# Patient Record
Sex: Male | Born: 1975 | Race: White | Hispanic: No | Marital: Married | Smoking: Never smoker
Health system: Southern US, Community
[De-identification: ages and names within clinical notes are randomized; demographics above are authoritative.]

## PROBLEM LIST (undated history)

## (undated) DIAGNOSIS — J45909 Unspecified asthma, uncomplicated: Secondary | ICD-10-CM

## (undated) DIAGNOSIS — E079 Disorder of thyroid, unspecified: Secondary | ICD-10-CM

## (undated) DIAGNOSIS — M109 Gout, unspecified: Secondary | ICD-10-CM

## (undated) HISTORY — DX: Gout, unspecified: M10.9

## (undated) HISTORY — DX: Unspecified asthma, uncomplicated: J45.909

## (undated) HISTORY — PX: ANTERIOR CRUCIATE LIGAMENT REPAIR: SHX115

---

## 2005-12-20 ENCOUNTER — Emergency Department: Payer: Self-pay | Admitting: Emergency Medicine

## 2009-08-16 ENCOUNTER — Emergency Department: Payer: Self-pay | Admitting: Emergency Medicine

## 2010-08-30 IMAGING — CR LEFT WRIST - COMPLETE 3+ VIEW
1 series · 4 of 4 positions shown · non-contrast
Comparison: none

REASON FOR EXAM: pain
COMMENTS:

PROCEDURE:     DXR - DXR WRIST LT COMP WITH OBLIQUES  - August 16, 2009  [DATE]
RESULT:     No fracture, dislocation or other acute bony abnormality is
identified.

[Series 1: view not recorded · 0.17mm/px · 4 of 4 slices shown]
[im 1/4]
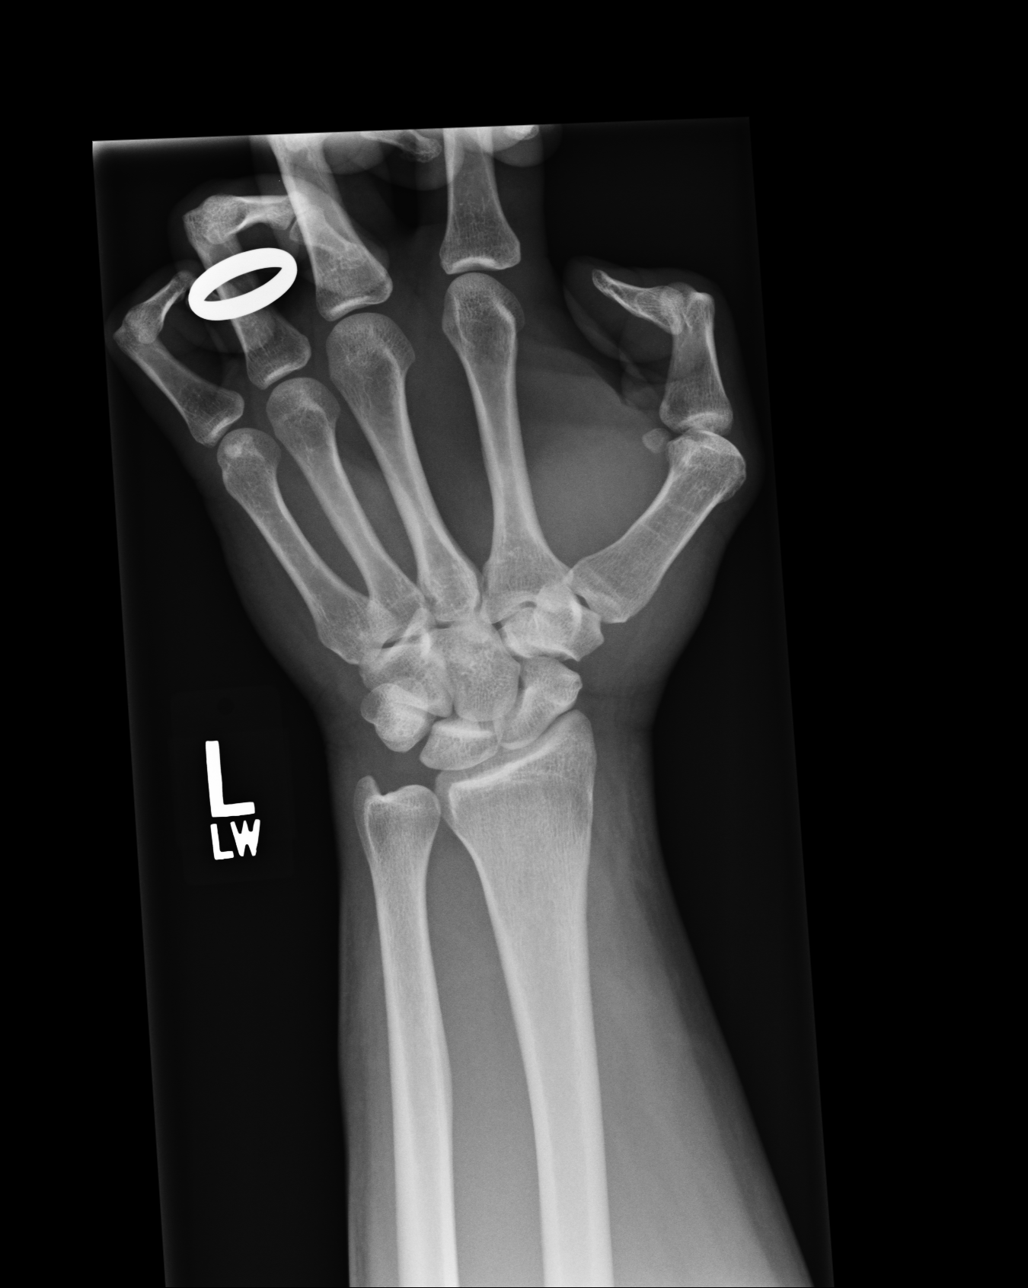
[im 2/4]
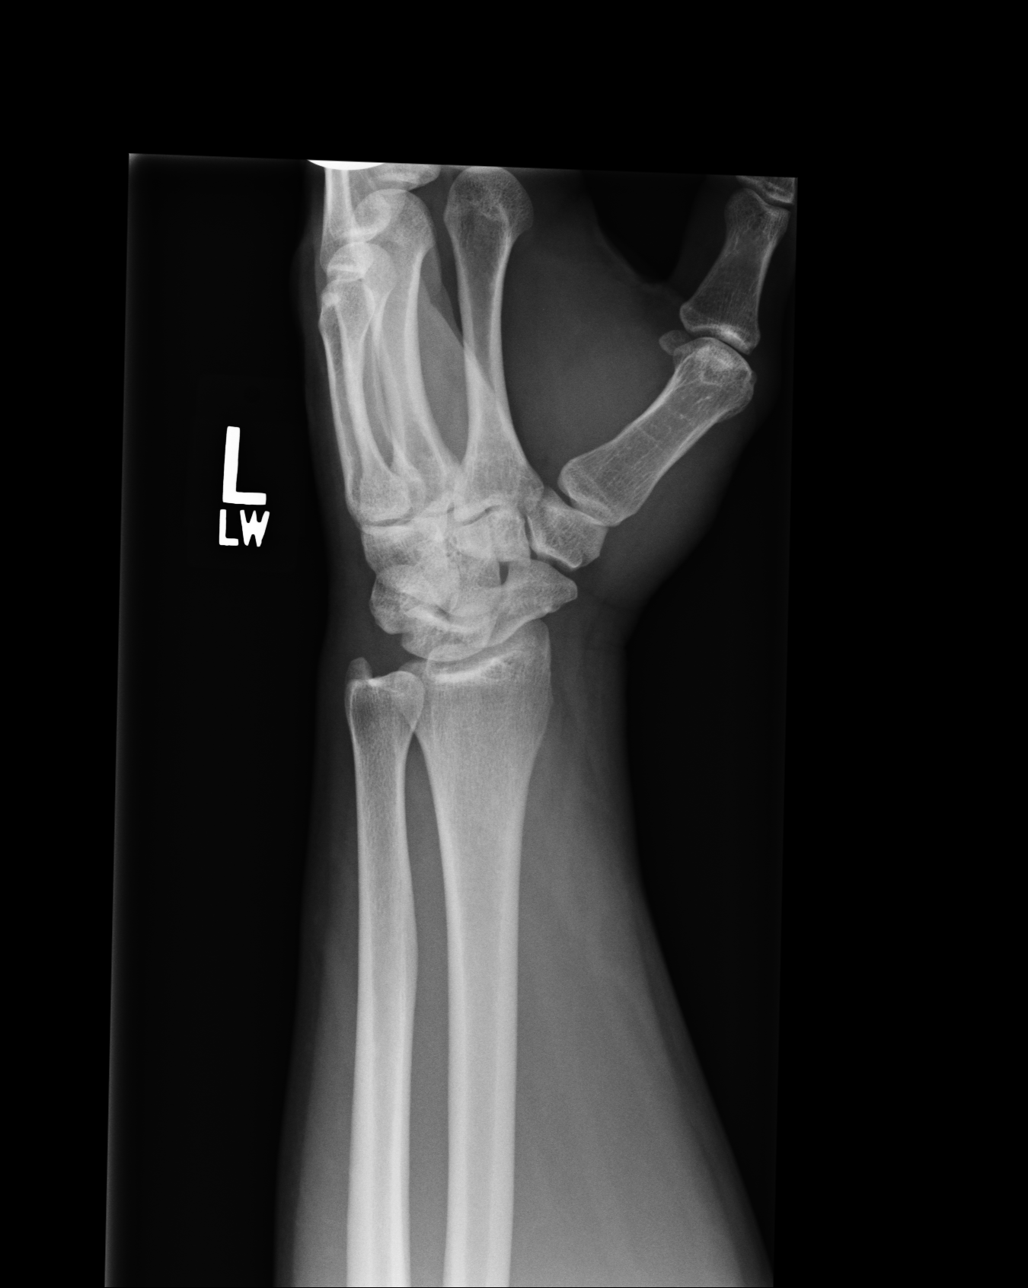
[im 3/4]
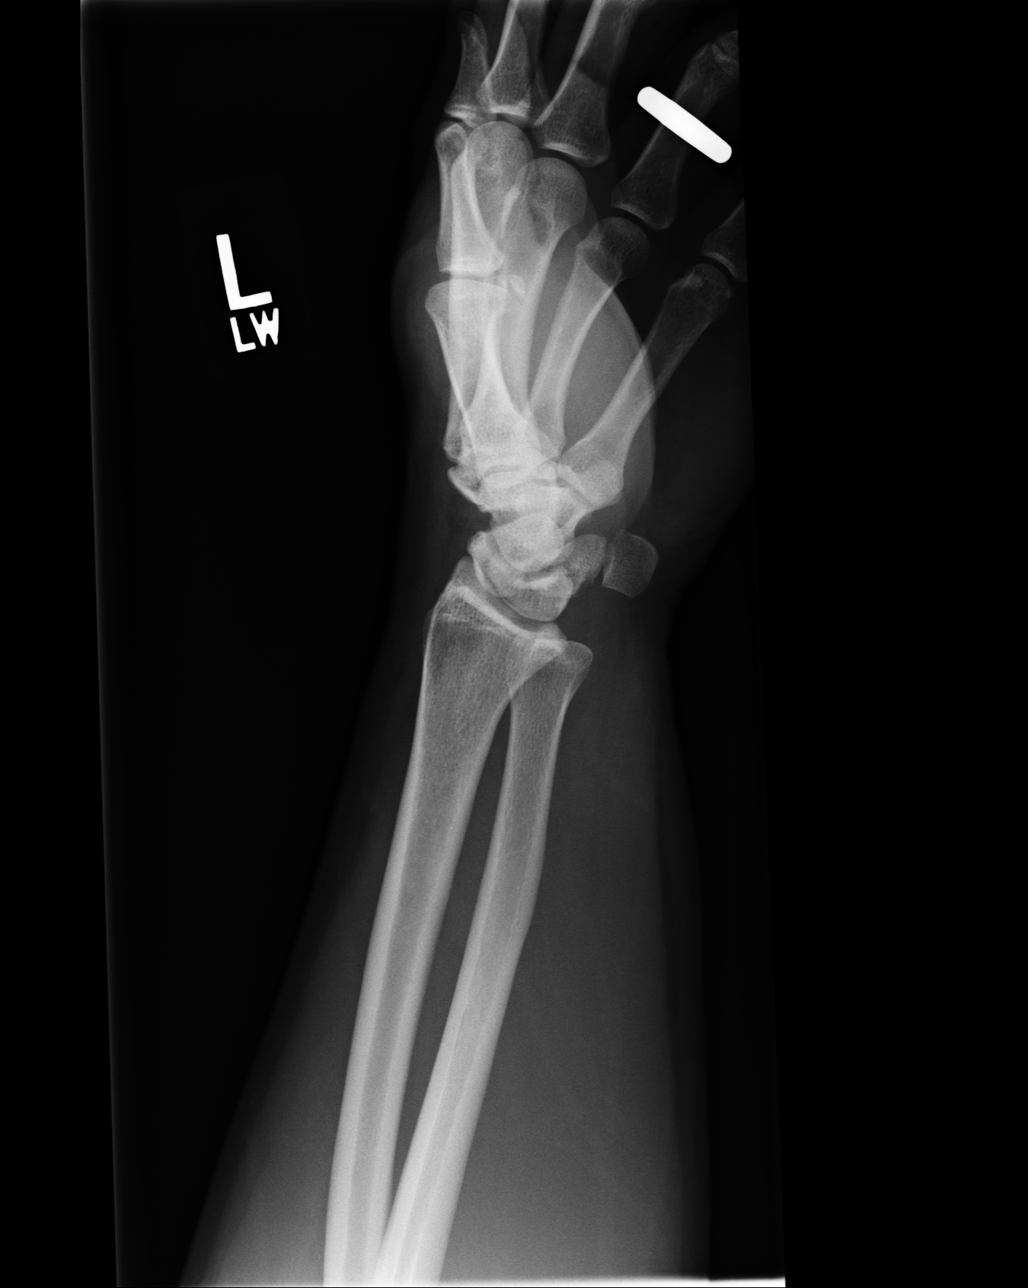
[im 4/4]
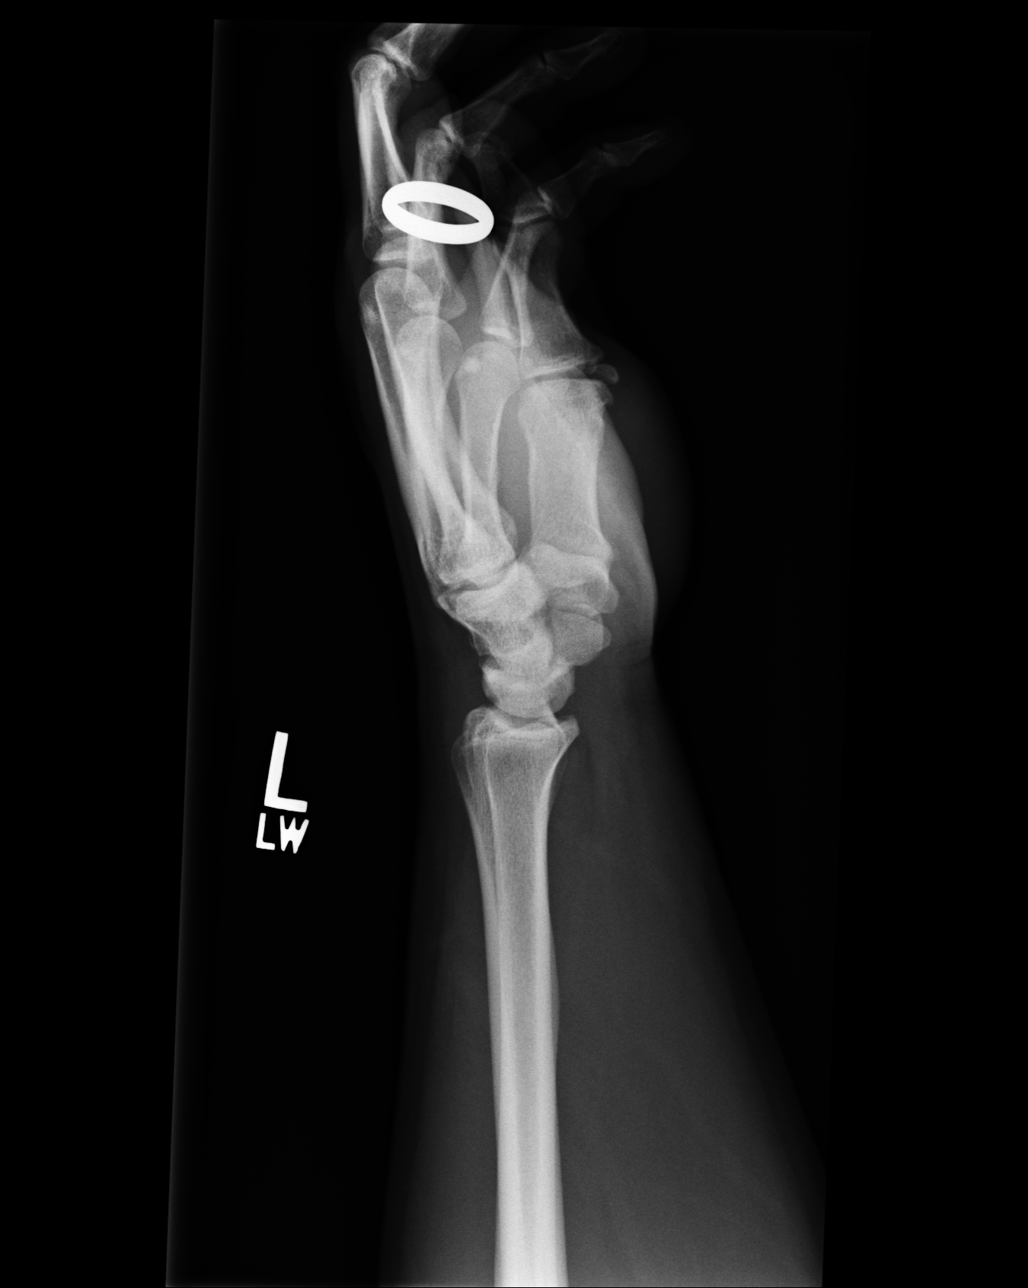

[4 of 4 positions shown; findings below may reference images not displayed]

IMPRESSION: No significant abnormalities are noted.

## 2010-09-08 ENCOUNTER — Ambulatory Visit: Payer: Self-pay | Admitting: Family Medicine

## 2010-09-19 ENCOUNTER — Ambulatory Visit: Payer: Self-pay | Admitting: Family Medicine

## 2011-05-07 ENCOUNTER — Ambulatory Visit: Payer: Self-pay | Admitting: Family Medicine

## 2011-05-12 ENCOUNTER — Ambulatory Visit: Payer: Self-pay | Admitting: Family Medicine

## 2013-08-26 ENCOUNTER — Emergency Department: Payer: Self-pay | Admitting: Emergency Medicine

## 2013-12-07 ENCOUNTER — Emergency Department: Payer: Self-pay | Admitting: Emergency Medicine

## 2014-12-21 IMAGING — CR DG TIBIA/FIBULA 2V*L*
1 series · 3 of 3 positions shown · non-contrast
Comparison: None.

CLINICAL DATA: Injury and heard a pop while kicking in a door. Mid
calf pain.

EXAM:
LEFT TIBIA AND FIBULA - 2 VIEW

[Series 1: x tib-fib ap left · 0.14mm/px · 3 of 3 slices shown]
[im 1/3]
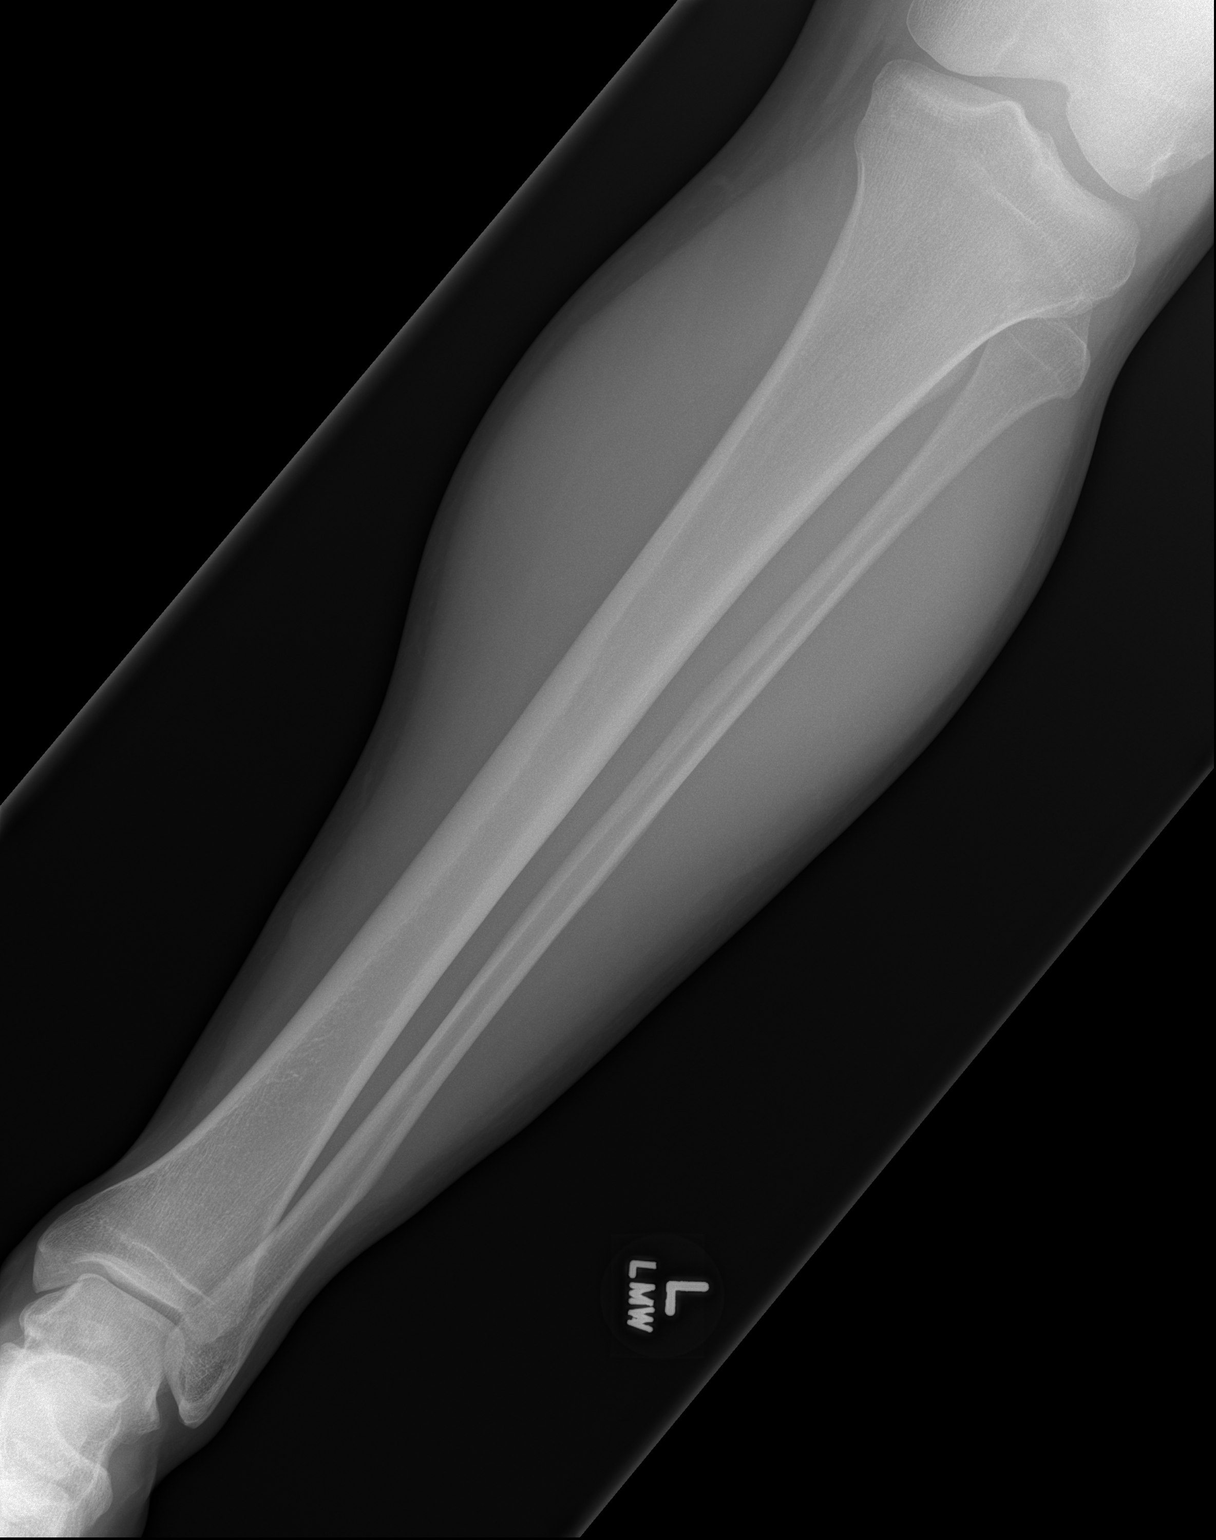
[im 2/3]
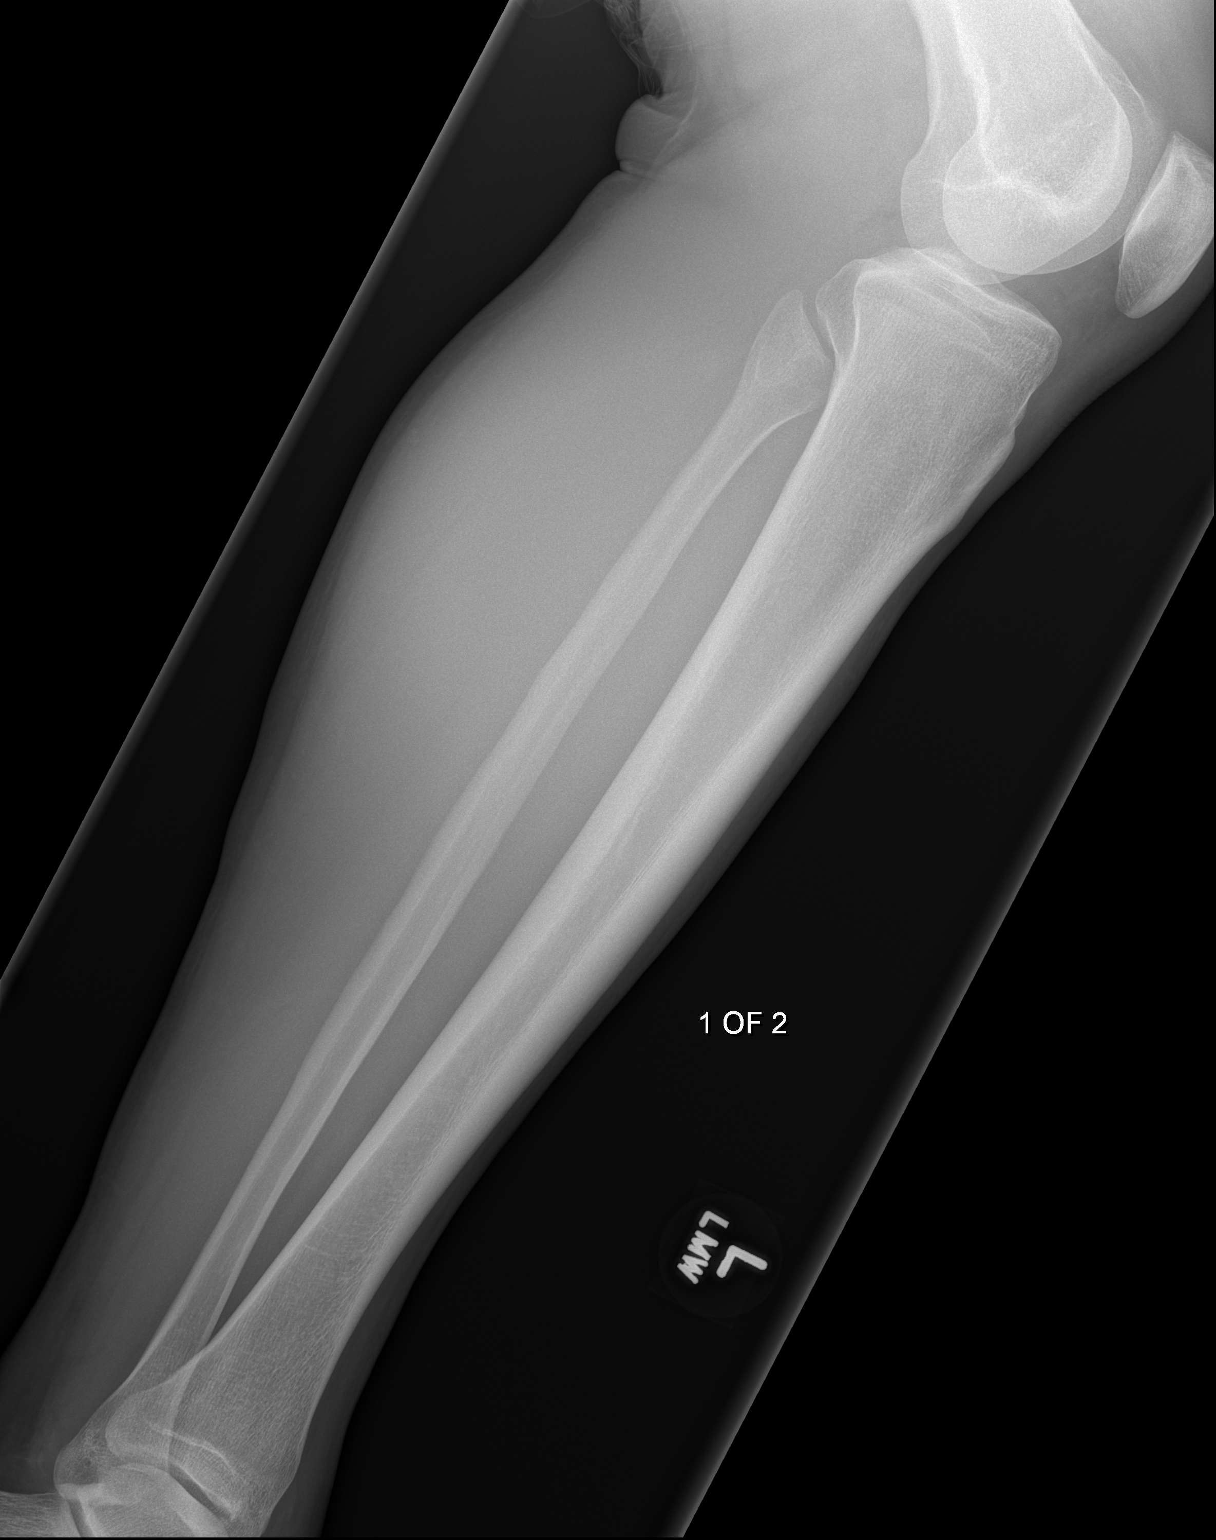
[im 3/3]
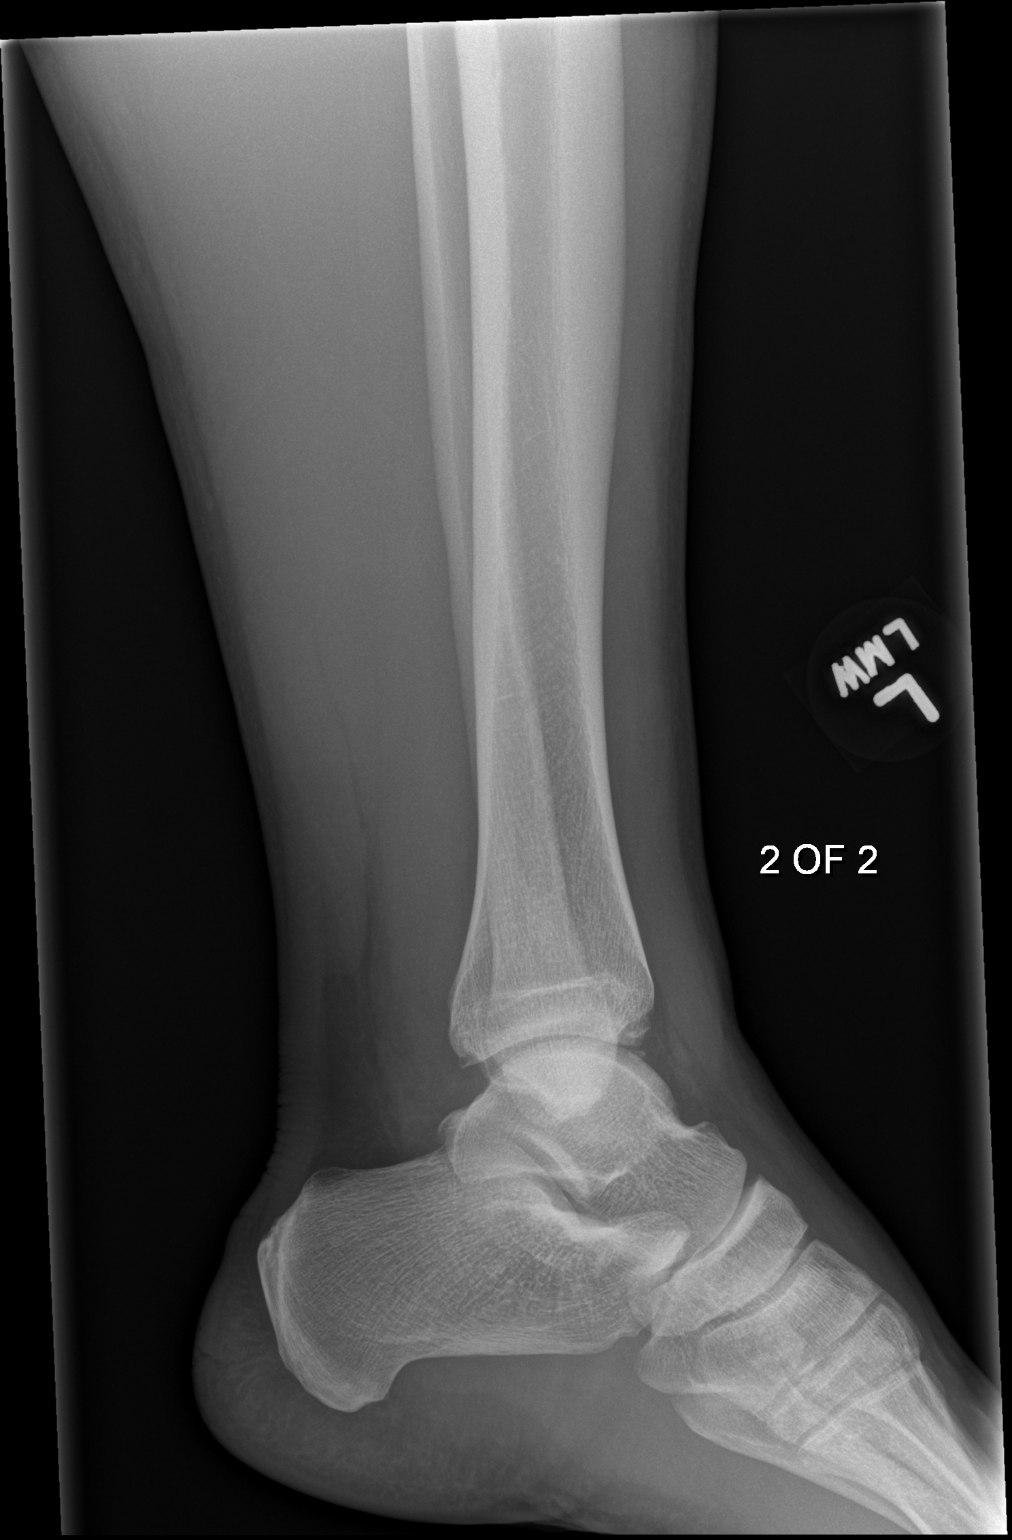

[3 of 3 positions shown; findings below may reference images not displayed]

FINDINGS: Degenerative changes in the ankle joint with old appearing ununited
ossicle inferior to the anterior aspect of the distal tibia. There
is no evidence of fracture or other focal bone lesions. Soft tissues
are unremarkable.
IMPRESSION: No acute bony abnormalities demonstrated. Old ununited ossicle
anterior to the tibiotalar joint could represent a loose body.

## 2015-12-19 ENCOUNTER — Ambulatory Visit (INDEPENDENT_AMBULATORY_CARE_PROVIDER_SITE_OTHER): Payer: 59 | Admitting: Obstetrics and Gynecology

## 2015-12-19 ENCOUNTER — Encounter: Payer: Self-pay | Admitting: Obstetrics and Gynecology

## 2015-12-19 VITALS — BP 140/95 | HR 71 | Resp 16 | Ht 72.0 in | Wt 257.8 lb

## 2015-12-19 DIAGNOSIS — Z3009 Encounter for other general counseling and advice on contraception: Secondary | ICD-10-CM

## 2015-12-19 DIAGNOSIS — Z309 Encounter for contraceptive management, unspecified: Secondary | ICD-10-CM | POA: Diagnosis not present

## 2015-12-19 MED ORDER — DIAZEPAM 10 MG PO TABS: ORAL_TABLET | ORAL | Status: DC

## 2015-12-19 NOTE — Patient Instructions (Signed)
                                                           Vasectomy                                    Patient Education and Post Care   If you were provided a sedative (Valium) you must have someone available to drive you home after your procedure. Avoid strenuous activity for 48 hours after your procedure. This includes any heavy lifting. You may return to work the next day, as long as no strenuous activity is required. Leave bandage in place for the next 24hrs. If you have any difficulty urinating remove you may remove bandage earlier. Then keep area clean and dry. You may shower after 24 hours. Do not take a bath or use a hot tub for five (5) days. You may apply ice or a cold pack to the scrotum as needed, for 10 minutes of every hours. (A bag of frozen peas will mold to the area). This may help reduce any pain or swelling. It may also be helpful to wear supportive underwear, such as briefs. Expect some mild pain, mild swelling of the scrotum and possible slight fluid leak from the site of the puncture. A gauze pad may be applied to the scrotum if there is any leakage from the puncture site. This drainage may continue for several days and is normal. You may continue your normal diet. After the procedure, you will be given 1-2 specimen cups. You need to do sperm checks at 6 and/or 12 weeks. Please bring the specimen back to our office to be sent out for analysis. You may resume having sex 1 week after procedure, if comfortable enough. Until you are told that you are sterile, it is essential that you use another form of birth control until otherwise notified by our office. Take Extra-Strength Tylenol, Motrin or Advil as needed for any pain or discomfort. Follow the package directions regarding dose. Stronger prescription pain medication is provided, but most people do not find that it is necessary. If you experience unusual or severe pain that is not relieved by pain medication, excessive bleeding  or drainage, excessive swelling or redness, foul odor or a fever over 101 F, please contact our office.  [facility] [address], [address] [place], [place] [address] [phone]     

## 2015-12-19 NOTE — Progress Notes (Signed)
12/19/2015 1:08 PM   Nicholas Russell [birth year] [id number]  Referring provider: No referring provider defined for this encounter.  Chief Complaint  Patient presents with  . VAS Consult  . Establish Care    HPI: Patient is a 40 year old male presenting today for vasectomy consultation. He is married with 2 biological children. His wife are not currently using any contraception. He denies any history of scrotal infections, injuries or chronic scrotal pain. He denies any bleeding or clotting disorders. He denies any previous reactions to look for anesthesia. States that he and his wife are in agreement that they desire no further children and are requesting permanent male sterilization.  PMH: Past Medical History  Diagnosis Date  . Asthma   . Gout     Surgical History: Past Surgical History  Procedure Laterality Date  . Anterior cruciate ligament repair Right 2000, 2001    Home Medications:    Medication List       This list is accurate as of: 12/19/15  1:08 PM.  Always use your most recent med list.               diazepam 10 MG tablet  Commonly known as:  VALIUM  Take 45 mins prior to procedure        Allergies: No Known Allergies  Family History: No family history on file.  Social History:  reports that he has never smoked. He does not have any smokeless tobacco history on file. He reports that he drinks alcohol. He reports that he does not use illicit drugs.  ROS: UROLOGY Frequent Urination?: No Hard to postpone urination?: No Burning/pain with urination?: No Get up at night to urinate?: No Leakage of urine?: No Urine stream starts and stops?: No Trouble starting stream?: No Do you have to strain to urinate?: No Blood in urine?: No Urinary tract infection?: No Sexually transmitted disease?: No Injury to kidneys or bladder?: No Painful intercourse?: No Weak stream?: No Erection problems?: No Penile pain?: No  Gastrointestinal Nausea?:  No Vomiting?: No Indigestion/heartburn?: No Diarrhea?: No Constipation?: No  Constitutional Fever: No Night sweats?: No Weight loss?: No Fatigue?: No  Skin Skin rash/lesions?: No Itching?: No  Eyes Blurred vision?: No Double vision?: No  Ears/Nose/Throat Sore throat?: No Sinus problems?: No  Hematologic/Lymphatic Swollen glands?: No Easy bruising?: No  Cardiovascular Leg swelling?: No Chest pain?: No  Respiratory Cough?: No Shortness of breath?: No  Endocrine Excessive thirst?: No  Musculoskeletal Back pain?: Yes Joint pain?: No  Neurological Headaches?: No Dizziness?: No  Psychologic Depression?: No Anxiety?: No  Physical Exam: BP 140/95 mmHg  Pulse 71  Resp 16  Ht 6' (1.829 m)  Wt 257 lb 12.8 oz (116.937 kg)  BMI 34.96 kg/m2  Constitutional:  Alert and oriented, No acute distress. HEENT: [place] AT, moist mucus membranes.  Trachea midline, no masses. Cardiovascular: No clubbing, cyanosis, or edema. Respiratory: Normal respiratory effort, no increased work of breathing. GU: Testicles descended bilaterally without palpable masses or tenderness, vas deferens palpable bilaterally Skin: No rashes, bruises or suspicious lesions. Neurologic: Grossly intact, no focal deficits, moving all 4 extremities. Psychiatric: Normal mood and affect.   Assessment & Plan:   Today, we discussed what the vas deferens is, where it is located, and its function. We reviewed the procedure for vasectomy, it's risks, benefits, alternatives, and likelihood of achieving his goals. We discussed in detail the procedure, complications, and recovery as well as the need for clearance prior to unprotected intercourse. We discussed that  vasectomy does not protect against sexually transmitted diseases. We discussed that this procedure does not result in immediate sterility and that they would need to use other forms of birth control until he has been cleared with negative postvasectomy  semen analyses. I explained that the procedure is considered to be permanent and that attempts at reversal have varying degrees of success. These options include vasectomy reversal, sperm retrieval, and in vitro fertilization; these can be very expensive. We discussed the chance of postvasectomy pain syndrome which occurs in less than 5% of patients. I explained to the patient that there is no treatment to resolve this chronic pain, and that if it developed I would not be able to help resolve the issue, but that surgery is generally not needed for correction. I explained there have even been reports of systemic like illness associated with this chronic pain, and that there was no good cure. I explained that vasectomy it is not a 100% reliable form of birth control, and the risk of pregnancy after vasectomy is approximately 1 in 2000 men who had a negative postvasectomy semen analysis or rare non-motile sperm. I explained that repeat vasectomy was necessary in less than 1% of vasectomy procedures when employing the type of technique that our surgeons use. I explained that he should refrain from ejaculation for approximately one week following vasectomy. I explained that there are other options for birth control which are permanent and non-permanent; we discussed these. I explained the rates of surgical complications, such as symptomatic hematoma or infection, are low (1-2%) and vary with the surgeon's experience and criteria used to diagnose the complication.  The patient had the opportunity to ask questions to his stated satisfaction. He voiced understanding of the above factors and stated that he has read all the information provided to him and the packets and informed consent was signed today.  1. Vasectomy consult:  Patient's questions have been answered and he was given the pre-op vasectomy instruction sheet.  He is prescribed Valium 10 mg and instructed to take it 30 minutes prior to his vasectomy  appointment.  He is to have a driver.  I reemphasized to the patient that this is to be considered a permanent form of birth control, that he is to use an alternative form of birth control until we receive the 3 months specimen and it is cleared of sperm and that this will not prevent STI's.  His questions are answered to his satisfaction and he understands the risks and is willing to proceed with the vasectomy.  He will schedule his vasectomy.    Patient was also examined by Dr. Sherryl Barters and deemed ok for in office procedure.  I spent 30 min with this patient of which greater than 50% was spent in counseling and coordination of care with the patient.   There are no diagnoses linked to this encounter.  Return for schedule vasectomy.  These notes generated with voice recognition software. I apologize for typographical errors.  Earlie Lou, FNP  [facility] [address] [place], [place] [address] [phone]

## 2016-01-09 ENCOUNTER — Encounter: Payer: Self-pay | Admitting: Urology

## 2016-02-06 ENCOUNTER — Encounter: Payer: Self-pay | Admitting: Urology

## 2016-08-21 DIAGNOSIS — R5383 Other fatigue: Secondary | ICD-10-CM | POA: Insufficient documentation

## 2016-08-21 DIAGNOSIS — J4520 Mild intermittent asthma, uncomplicated: Secondary | ICD-10-CM | POA: Insufficient documentation

## 2016-10-28 DIAGNOSIS — E349 Endocrine disorder, unspecified: Secondary | ICD-10-CM | POA: Insufficient documentation

## 2017-01-21 ENCOUNTER — Ambulatory Visit (INDEPENDENT_AMBULATORY_CARE_PROVIDER_SITE_OTHER): Payer: Managed Care, Other (non HMO)

## 2017-01-21 ENCOUNTER — Ambulatory Visit
Admission: EM | Admit: 2017-01-21 | Discharge: 2017-01-21 | Disposition: A | Discharge status: Home / Self Care | Payer: Managed Care, Other (non HMO) | Attending: Family Medicine | Admitting: Family Medicine

## 2017-01-21 ENCOUNTER — Encounter: Payer: Self-pay | Admitting: *Deleted

## 2017-01-21 DIAGNOSIS — S62653A Nondisplaced fracture of medial phalanx of left middle finger, initial encounter for closed fracture: Secondary | ICD-10-CM

## 2017-01-21 DIAGNOSIS — S62629A Displaced fracture of medial phalanx of unspecified finger, initial encounter for closed fracture: Secondary | ICD-10-CM

## 2017-01-21 NOTE — ED Triage Notes (Signed)
Left middle finger pain and edema x1 month. Unknown injury over a month ago. Left middle finger appears mildly edematous.

## 2017-01-21 NOTE — ED Provider Notes (Signed)
[facility]    CSN: [id number] Arrival date & time: 01/21/17  1315     History   Chief Complaint Chief Complaint  Patient presents with  . Finger Injury    HPI Nicholas Russell is a 41 y.o. male.   41 yo male with a c/o left middle finger pain for one month with intermittent swelling. Denies any specific trauma, fall, direct injury, fevers, chills, rash, drainage.    The history is provided by the patient.  Hand Pain     Past Medical History:  Diagnosis Date  . Asthma   . Gout     There are no active problems to display for this patient.   Past Surgical History:  Procedure Laterality Date  . ANTERIOR CRUCIATE LIGAMENT REPAIR Right 2000, 2001       Home Medications    Prior to Admission medications   Medication Sig Start Date End Date Taking? Authorizing Provider  diazepam (VALIUM) 10 MG tablet Take 45 mins prior to procedure 12/19/15   Fernanda DrumLindsay C Overton, FNP    Family History History reviewed. No pertinent family history.  Social History Social History  Substance Use Topics  . Smoking status: Never Smoker  . Smokeless tobacco: Never Used  . Alcohol use 0.0 oz/week     Allergies   Patient has no known allergies.   Review of Systems Review of Systems   Physical Exam Triage Vital Signs ED Triage Vitals  Enc Vitals Group     BP 01/21/17 1332 136/78     Pulse Rate 01/21/17 1332 80     Resp 01/21/17 1332 16     Temp 01/21/17 1332 98.6 F (37 C)     Temp Source 01/21/17 1332 Oral     SpO2 01/21/17 1332 97 %     Weight 01/21/17 1334 265 lb (120.2 kg)     Height 01/21/17 1334 6' (1.829 m)     Head Circumference --      Peak Flow --      Pain Score --      Pain Loc --      Pain Edu? --      Excl. in GC? --    No data found.   Updated Vital Signs BP 136/78 (BP Location: Left Arm)   Pulse 80   Temp 98.6 F (37 C) (Oral)   Resp 16   Ht 6' (1.829 m)   Wt 265 lb (120.2 kg)   SpO2 97%   BMI 35.94 kg/m   Visual  Acuity Right Eye Distance:   Left Eye Distance:   Bilateral Distance:    Right Eye Near:   Left Eye Near:    Bilateral Near:     Physical Exam  Constitutional: He appears well-developed and well-nourished. No distress.  Musculoskeletal:       Left hand: He exhibits tenderness, bony tenderness (PIP joint) and swelling. He exhibits normal range of motion, normal two-point discrimination, normal capillary refill, no deformity and no laceration. Normal sensation (mild; PIP joint) noted. Normal strength noted.  Skin: He is not diaphoretic.  Nursing note and vitals reviewed.    UC Treatments / Results  Labs (all labs ordered are listed, but only abnormal results are displayed) Labs Reviewed - No data to display  EKG  EKG Interpretation None       Radiology Dg Finger Middle Left  Result Date: 01/21/2017 CLINICAL DATA:  Left middle pain and edema for the past month following unknown injury.  Visible swelling. Pain is centered at the PIP joint. EXAM: LEFT MIDDLE FINGER 2+V COMPARISON:  None in PACs FINDINGS: The patient has sustained a tiny avulsion from the ventral aspect of the base of the middle phalanx. The joint space is reasonably well-maintained. There is mild soft tissue swelling over the proximal phalanx and PIP joint. The distal phalanx and the PIP joint appear normal. The third metacarpal also appears normal. IMPRESSION: Findings compatible with a small avulsion fracture from the ventral aspect of the base of the middle phalanx of the left third finger. Electronically Signed   By: David  [place] M.D.   On: 01/21/2017 14:33    Procedures Procedures (including critical care time)  Medications Ordered in UC Medications - No data to display   Initial Impression / Assessment and Plan / UC Course  I have reviewed the triage vital signs and the nursing notes.  Pertinent labs & imaging results that were available during my care of the patient were reviewed by me and considered  in my medical decision making (see chart for details).       Final Clinical Impressions(s) / UC Diagnoses   Final diagnoses:  Closed avulsion fracture of middle phalanx of finger, initial encounter  Nondisplaced fracture of middle phalanx of left middle finger, initial encounter for closed fracture    New Prescriptions Discharge Medication List as of 01/21/2017  3:15 PM     1. x-ray results and diagnosis reviewed with patient 2. Finger splint applied for immobilization 3. Recommend supportive treatment with otc analgesics prn 4. Follow-up with orthopedist hand specialist for further evaluation and management (patient verbalizes understanding and will call in am to set up appointment at [facility] in [place] where he has been seen in the past)    Payton Mccallum, MD 01/21/17 1816

## 2017-01-21 NOTE — Discharge Instructions (Signed)
Recommend follow up with Hand Orthopedist for further evaluation and management

## 2017-09-17 ENCOUNTER — Emergency Department: Payer: Worker's Compensation

## 2017-09-17 ENCOUNTER — Encounter: Payer: Self-pay | Admitting: Emergency Medicine

## 2017-09-17 ENCOUNTER — Emergency Department
Admission: EM | Admit: 2017-09-17 | Discharge: 2017-09-17 | Disposition: A | Discharge status: Home / Self Care | Payer: Worker's Compensation | Attending: Emergency Medicine | Admitting: Emergency Medicine

## 2017-09-17 DIAGNOSIS — Y9389 Activity, other specified: Secondary | ICD-10-CM | POA: Insufficient documentation

## 2017-09-17 DIAGNOSIS — Y990 Civilian activity done for income or pay: Secondary | ICD-10-CM | POA: Insufficient documentation

## 2017-09-17 DIAGNOSIS — S0990XA Unspecified injury of head, initial encounter: Secondary | ICD-10-CM | POA: Diagnosis present

## 2017-09-17 DIAGNOSIS — J45909 Unspecified asthma, uncomplicated: Secondary | ICD-10-CM | POA: Diagnosis not present

## 2017-09-17 DIAGNOSIS — W208XXA Other cause of strike by thrown, projected or falling object, initial encounter: Secondary | ICD-10-CM | POA: Diagnosis not present

## 2017-09-17 DIAGNOSIS — M25512 Pain in left shoulder: Secondary | ICD-10-CM | POA: Diagnosis not present

## 2017-09-17 DIAGNOSIS — M25561 Pain in right knee: Secondary | ICD-10-CM | POA: Diagnosis not present

## 2017-09-17 DIAGNOSIS — Y92009 Unspecified place in unspecified non-institutional (private) residence as the place of occurrence of the external cause: Secondary | ICD-10-CM | POA: Diagnosis not present
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
## 2017-09-17 NOTE — ED Notes (Signed)
Spoke with patients supervisor, Shavar Gorka [phone]. He states nothing is required for workers comp.

## 2017-09-17 NOTE — Discharge Instructions (Signed)
please return for worsening headache, double vision, vomiting or not feeling like your self. follow-up with Dr. Rosita Kea or Dr. Ernest Pine for the shoulder and the knee.  he can follow-up with orthopedics for the shoulder and knee.

## 2017-09-17 NOTE — ED Notes (Signed)
Patient refuses c collar and hospital gown.

## 2017-09-17 NOTE — ED Notes (Signed)
Pt. Verbalizes understanding of d/c instructions and follow-up. VS stable and pain controlled per pt.  Pt. In NAD at time of d/c and denies further concerns regarding this visit. Pt. Stable at the time of departure from the unit, departing unit by the safest and most appropriate manner per that pt condition and limitations. Pt advised to return to the ED at any time for emergent concerns, or for new/worsening symptoms.   
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
## 2017-09-21 ENCOUNTER — Emergency Department
Admission: EM | Admit: 2017-09-21 | Discharge: 2017-09-21 | Disposition: A | Discharge status: Home / Self Care | Payer: Worker's Compensation | Attending: Emergency Medicine | Admitting: Emergency Medicine

## 2017-09-21 ENCOUNTER — Emergency Department: Payer: Worker's Compensation

## 2017-09-21 DIAGNOSIS — Y939 Activity, unspecified: Secondary | ICD-10-CM | POA: Insufficient documentation

## 2017-09-21 DIAGNOSIS — S0990XA Unspecified injury of head, initial encounter: Secondary | ICD-10-CM | POA: Diagnosis present

## 2017-09-21 DIAGNOSIS — Y999 Unspecified external cause status: Secondary | ICD-10-CM | POA: Insufficient documentation

## 2017-09-21 DIAGNOSIS — W208XXA Other cause of strike by thrown, projected or falling object, initial encounter: Secondary | ICD-10-CM | POA: Insufficient documentation

## 2017-09-21 DIAGNOSIS — S060X9A Concussion with loss of consciousness of unspecified duration, initial encounter: Secondary | ICD-10-CM | POA: Diagnosis not present

## 2017-09-21 DIAGNOSIS — J45909 Unspecified asthma, uncomplicated: Secondary | ICD-10-CM | POA: Insufficient documentation

## 2017-09-21 DIAGNOSIS — Y929 Unspecified place or not applicable: Secondary | ICD-10-CM | POA: Insufficient documentation

## 2017-09-21 DIAGNOSIS — S060X1A Concussion with loss of consciousness of 30 minutes or less, initial encounter: Secondary | ICD-10-CM

## 2017-09-21 MED ORDER — BUTALBITAL-APAP-CAFFEINE 50-325-40 MG PO TABS: 1.0000 | ORAL_TABLET | Freq: Four times a day (QID) | ORAL | 0 refills | Status: AC | PRN

## 2017-09-21 MED ORDER — ONDANSETRON 4 MG PO TBDP
4.0000 mg | ORAL_TABLET | Freq: Once | ORAL | Status: AC
  Administered 2017-09-21: 4 mg via ORAL
  Filled 2017-09-21: qty 1

## 2017-09-21 MED ORDER — ONDANSETRON HCL 4 MG PO TABS: 4.0000 mg | ORAL_TABLET | Freq: Every day | ORAL | 0 refills | Status: DC | PRN
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
## 2017-09-21 NOTE — ED Notes (Signed)
Patient refusing to use wheelchair to be transported to CT at this time.
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
## 2019-05-16 ENCOUNTER — Ambulatory Visit: Payer: Managed Care, Other (non HMO) | Admitting: Adult Health

## 2019-05-16 ENCOUNTER — Other Ambulatory Visit: Payer: Self-pay

## 2019-05-16 ENCOUNTER — Encounter: Payer: Self-pay | Admitting: Adult Health

## 2019-05-16 VITALS — BP 118/82 | HR 82 | Temp 98.6°F | Resp 16

## 2019-05-16 DIAGNOSIS — S8390XA Sprain of unspecified site of unspecified knee, initial encounter: Secondary | ICD-10-CM | POA: Insufficient documentation

## 2019-05-16 DIAGNOSIS — W57XXXA Bitten or stung by nonvenomous insect and other nonvenomous arthropods, initial encounter: Secondary | ICD-10-CM | POA: Diagnosis not present

## 2019-05-16 DIAGNOSIS — M109 Gout, unspecified: Secondary | ICD-10-CM | POA: Insufficient documentation

## 2019-05-16 DIAGNOSIS — R591 Generalized enlarged lymph nodes: Secondary | ICD-10-CM

## 2019-05-16 DIAGNOSIS — R599 Enlarged lymph nodes, unspecified: Secondary | ICD-10-CM

## 2019-05-16 LAB — CBC WITH DIFFERENTIAL/PLATELET
Basophils Absolute: 0 10*3/uL (ref 0.0–0.2)
Basos: 0 %
EOS (ABSOLUTE): 0.2 10*3/uL (ref 0.0–0.4)
Eos: 2 %
Hematocrit: 44.9 % (ref 37.5–51.0)
Hemoglobin: 15.3 g/dL (ref 13.0–17.7)
Immature Grans (Abs): 0 10*3/uL (ref 0.0–0.1)
Immature Granulocytes: 0 %
Lymphocytes Absolute: 2.1 10*3/uL (ref 0.7–3.1)
Lymphs: 21 %
MCH: 30.7 pg (ref 26.6–33.0)
MCHC: 34.1 g/dL (ref 31.5–35.7)
MCV: 90 fL (ref 79–97)
Monocytes Absolute: 1 10*3/uL — ABNORMAL HIGH (ref 0.1–0.9)
Monocytes: 10 %
Neutrophils Absolute: 6.5 10*3/uL (ref 1.4–7.0)
Neutrophils: 67 %
Platelets: 255 10*3/uL (ref 150–450)
RBC: 4.98 x10E6/uL (ref 4.14–5.80)
RDW: 12.3 % (ref 11.6–15.4)
WBC: 9.8 10*3/uL (ref 3.4–10.8)

## 2019-05-16 LAB — COMPREHENSIVE METABOLIC PANEL
ALT: 23 IU/L (ref 0–44)
AST: 23 IU/L (ref 0–40)
Albumin/Globulin Ratio: 1.9 (ref 1.2–2.2)
Albumin: 4.7 g/dL (ref 4.0–5.0)
Alkaline Phosphatase: 65 IU/L (ref 39–117)
BUN/Creatinine Ratio: 20 (ref 9–20)
BUN: 22 mg/dL (ref 6–24)
Bilirubin Total: 0.5 mg/dL (ref 0.0–1.2)
CO2: 20 mmol/L (ref 20–29)
Calcium: 9.8 mg/dL (ref 8.7–10.2)
Chloride: 104 mmol/L (ref 96–106)
Creatinine, Ser: 1.09 mg/dL (ref 0.76–1.27)
GFR calc Af Amer: 96 mL/min/{1.73_m2} (ref >59)
GFR calc non Af Amer: 83 mL/min/{1.73_m2} (ref >59)
Globulin, Total: 2.5 g/dL (ref 1.5–4.5)
Glucose: 87 mg/dL (ref 65–99)
Potassium: 4 mmol/L (ref 3.5–5.2)
Sodium: 139 mmol/L (ref 134–144)
Total Protein: 7.2 g/dL (ref 6.0–8.5)

## 2019-05-16 MED ORDER — DOXYCYCLINE HYCLATE 100 MG PO TABS: 100.0000 mg | ORAL_TABLET | Freq: Two times a day (BID) | ORAL | 0 refills | Status: DC

## 2019-05-16 NOTE — Progress Notes (Addendum)
[facility] Subjective:     Patient ID: Nicholas Russell, male   DOB: 1976/08/01, 43 y.o.   MRN: [id number]  Blood pressure 118/82, pulse 82, temperature 98.6 F (37 C), temperature source Oral, resp. rate 16, SpO2 97 %. HPI  Patient is a 43 year old male in no acute distress who comes to the clinic today after he removed a tick Friday 05/13/19 he feels it could have possibly been attached two to three days. It was on his right testicle. He had itching.  Yesterday 05/15/19 he had swelling to the skin on his right testicle, denies any pain. He also reports swelling in his groin with itching. He denies any pain.  He reports mild pain since removing the tick at the site.   He reports everything was normal prior to removing the tick.  He removed the tick with tweezers.  He has  Mild fatigue that started yesterday, denies headache, chills or any rash.  He feels as if he rempoved a lot in swelling in his groin since yesterday. He has continued to work.  Feeels fatugue. No fever.   No rash. Redness is improving in groin area.  He denies ever having this type of reaction to a tick bite in the past.   No difficulty urinating.  He denies any pain.  He is urinating normally.  Defecation is normal.  Patient  denies any fever, body aches,chills, rash, chest pain, shortness of breath, nausea, vomiting, or diarrhea.    Review of Systems  Constitutional: Positive for fatigue. Negative for activity change, appetite change, chills, diaphoresis, fever and unexpected weight change.  HENT: Negative.   Eyes: Negative.   Respiratory: Negative.   Cardiovascular: Negative.   Gastrointestinal: Negative.   Endocrine: Negative.   Genitourinary: Negative.  Negative for decreased urine volume, difficulty urinating, discharge, dysuria, enuresis, flank pain, frequency, genital sores, hematuria, penile pain, penile swelling, scrotal swelling, testicular pain and urgency.   Musculoskeletal: Negative.   Skin: Positive for color change. Negative for pallor, rash and wound.  Allergic/Immunologic: Negative.   Neurological: Negative.   Hematological: Positive for adenopathy (right groin ). Does not bruise/bleed easily.  Psychiatric/Behavioral: Negative.        Objective:   Physical Exam Vitals signs reviewed.  Constitutional:      General: He is not in acute distress.    Appearance: Normal appearance. He is normal weight. He is not ill-appearing, toxic-appearing or diaphoretic.  HENT:     Head: Normocephalic and atraumatic.     Right Ear: There is no impacted cerumen.     Left Ear: There is no impacted cerumen.     Nose: Nose normal. No congestion or rhinorrhea.     Mouth/Throat:     Pharynx: No oropharyngeal exudate or posterior oropharyngeal erythema.  Eyes:     General: No scleral icterus.       Right eye: No discharge.        Left eye: No discharge.  Cardiovascular:     Rate and Rhythm: Normal rate and regular rhythm.     Pulses: Normal pulses.     Heart sounds: Normal heart sounds. No murmur. No friction rub. No gallop.   Pulmonary:     Effort: Pulmonary effort is normal. No respiratory distress.     Breath sounds: Normal breath sounds. No stridor. No wheezing, rhonchi or rales.  Chest:     Chest wall: No tenderness.  Abdominal:     General: Bowel  sounds are normal. There is no distension.     Palpations: There is no mass.     Tenderness: There is no abdominal tenderness. There is no right CVA tenderness, left CVA tenderness, guarding or rebound.     Hernia: No hernia is present.  Genitourinary:    Pubic Area: No rash or pubic lice.      Penis: Erythema and swelling present. No tenderness, discharge or lesions.      Scrotum/Testes: Normal. Cremasteric reflex is present.        Right: Tenderness or swelling not present.        Left: Tenderness or swelling not present.     Epididymis:     Right: Normal.     Left: Normal.       Comments:  Right groin adenopathy mobile mildly tender in area on diagram overlying skin  Pink in color, no warmth, no discharge.  Area of tick bite marked on diagram right testicle, small scab present, no surrounding erythema. No edema. No rash on right testicle.  Musculoskeletal: Normal range of motion.  Skin:    General: Skin is warm.     Capillary Refill: Capillary refill takes less than 2 seconds.     Coloration: Skin is not jaundiced or pale.     Findings: Erythema present. No bruising, lesion or rash.     Nails: There is no clubbing.   Neurological:     General: No focal deficit present.     Mental Status: He is alert and oriented to person, place, and time.     Motor: No weakness.     Gait: Gait normal.  Psychiatric:        Mood and Affect: Mood normal.        Behavior: Behavior normal.        Thought Content: Thought content normal.        Judgment: Judgment normal.    Bretlyn Ward CMA available for chaperone.     Assessment:     Tick bite, initial encounter - Plan: B. burgdorfi antibodies, CBC with Differential/Platelet, Comprehensive metabolic panel, CANCELED: CBC with Differential/Platelet  Adenopathy      Plan:     He is aware if any symptoms change or worsen from what they are now he will go to the emergency room immediately.  Follow up in three 3 days.   Fayrene FearingJames was seen today for tick bite.  Diagnoses and all orders for this visit:  Tick bite, initial encounter -     Cancel: CBC with Differential/Platelet -     B. burgdorfi antibodies -     CBC with Differential/Platelet -     Comprehensive metabolic panel  Other orders -     doxycycline (VIBRA-TABS) 100 MG tablet; Take 1 tablet (100 mg total) by mouth 2 (two) times daily.  Ordered STAT CBC AND CMP.   Patient is a advised to go to  The emergency room should any symptoms worsen, increased swelling, any pain or any new symptoms develop.   Provider thoroughly discussed in collaboration above plan with supervising  physician Dr. Julieanne Mansonichard Gilbert who is in agreement with the care plan as above.

## 2019-05-16 NOTE — Patient Instructions (Addendum)
Tick Bite Information, Adult  Ticks are insects that can bite. Most ticks live in shrubs and grassy areas. They climb onto people and animals that go by. Then they bite. Some ticks carry germs that can make you sick. How can I prevent tick bites?  Use an insect repellent that has 20% or higher of the ingredients DEET, picaridin, or IR3535. Put this insect repellent on: ? Bare skin. ? The tops of your boots. ? Your pant legs. ? The ends of your sleeves.  If you use an insect repellent that has the ingredient permethrin, make sure to follow the instructions on the bottle. Treat the following: ? Clothing. ? Supplies. ? Boots. ? Tents.  Wear long sleeves, long pants, and light colors.  Tuck your pant legs into your socks.  Stay in the middle of the trail.  Try not to walk through long grass.  Before going inside your house, check your clothes, hair, and skin for ticks. Make sure to check your head, neck, armpits, waist, groin, and joint areas.  Check for ticks every day.  When you come indoors: ? Wash your clothes right away. ? Shower right away. ? Dry your clothes in a dryer on high heat for 60 minutes or more. What is the right way to remove a tick? Remove a tick from your skin as soon as possible.  To remove a tick that is crawling on your skin: ? Go outdoors and brush the tick off. ? Use tape or a lint roller.  To remove a tick that is biting: ? Wash your hands. ? If you have latex gloves, put them on. ? Use tweezers, curved forceps, or a tick-removal tool to grasp the tick. Grasp the tick as close to your skin and as close to the tick's head as possible. ? Gently pull up until the tick lets go.  Try to keep the tick's head attached to its body.  Do not twist or jerk the tick.  Do not squeeze or crush the tick. Do not try to remove a tick with heat, alcohol, petroleum jelly, or fingernail polish. How should I get rid of a tick? Here are some ways to get rid of a  tick that is alive:  Place the tick in rubbing alcohol.  Place the tick in a bag or container you can close tightly.  Wrap the tick tightly in tape.  Flush the tick down the toilet. Contact a doctor if:  You have symptoms of a disease, such as: ? Pain in a muscle, joint, or bone. ? Trouble walking or moving your legs. ? Numbness in your legs. ? Inability to move (paralysis). ? A red rash that makes a circle (bull's-eye rash). ? Redness and swelling where the tick bit you. ? A fever. ? Throwing up (vomiting) over and over. ? Diarrhea. ? Weight loss. ? Tender and swollen lymph glands. ? Shortness of breath. ? Cough. ? Belly pain (abdominal pain). ? Headache. ? Being more tired than normal. ? A change in how alert (conscious) you are. ? Confusion. Get help right away if:  You cannot remove a tick.  A part of a tick breaks off and gets stuck in your skin.  You are feeling worse. Summary  Ticks may carry germs that can make you sick.  To prevent tick bites, wear long sleeves, long pants, and light colors. Use insect repellent. Follow the instructions on the bottle.  If the tick is biting, do not try to remove   it with heat, alcohol, petroleum jelly, or fingernail polish.  Use tweezers, curved forceps, or a tick-removal tool to grasp the tick. Gently pull up until the tick lets go. Do not twist or jerk the tick. Do not squeeze or crush the tick.  If you have symptoms, contact a doctor. This information is not intended to replace advice given to you by your health care provider. Make sure you discuss any questions you have with your health care provider. Document Released: 02/18/2010 Document Revised: 03/06/2017 Document Reviewed: 03/06/2017 Elsevier Interactive Patient Education  2019 Else[employer]. Lymphadenopathy  Lymphadenopathy means that your lymph glands are swollen or larger than normal (enlarged). Lymph glands, also called lymph nodes, are collections of tissue  that filter bacteria, viruses, and waste from your bloodstream. They are part of your body's disease-fighting system (immune system), which protects your body from germs. There may be different causes of lymphadenopathy, depending on where it is in your body. Some types go away on their own. Lymphadenopathy can occur anywhere that you have lymph glands, including these areas:  Neck (cervical lymphadenopathy).  Chest (mediastinal lymphadenopathy).  Lungs (hilar lymphadenopathy).  Underarms (axillary lymphadenopathy).  Groin (inguinal lymphadenopathy). When your immune system responds to germs, infection-fighting cells and fluid build up in your lymph glands. This causes some swelling and enlargement. If the lymph glands do not go back to normal after you have an infection or disease, your health care provider may do tests. These tests help to monitor your condition and find the reason why the glands are still swollen and enlarged. Follow these instructions at home:  Get plenty of rest.  Take over-the-counter and prescription medicines only as told by your health care provider. Your health care provider may recommend over-the-counter medicines for pain.  If directed, apply heat to swollen lymph glands as often as told by your health care provider. Use the heat source that your health care provider recommends, such as a moist heat pack or a heating pad. ? Place a towel between your skin and the heat source. ? Leave the heat on for 20-30 minutes. ? Remove the heat if your skin turns bright red. This is especially important if you are unable to feel pain, heat, or cold. You may have a greater risk of getting burned.  Check your affected lymph glands every day for changes. Check other lymph gland areas as told by your health care provider. Check for changes such as: ? More swelling. ? Sudden increase in size. ? Redness or pain. ? Hardness.  Keep all follow-up visits as told by your health  care provider. This is important. Contact a health care provider if you have:  Swelling that gets worse or spreads to other areas.  Problems with breathing.  Lymph glands that: ? Are still swollen after 2 weeks. ? Have suddenly gotten bigger. ? Are red, painful, or hard.  A fever or chills.  Fatigue.  A sore throat.  Pain in your abdomen.  Weight loss.  Night sweats. Get help right away if you have:  Fluid leaking from an enlarged lymph gland.  Severe pain.  Chest pain.  Shortness of breath. Summary  Lymphadenopathy means that your lymph glands are swollen or larger than normal (enlarged).  Lymph glands (also called lymph nodes) are collections of tissue that filter bacteria, viruses, and waste from the bloodstream. They are part of your body's disease-fighting system (immune system).  Lymphadenopathy can occur anywhere that you have lymph glands.  If your enlarged  and swollen lymph glands do not go back to normal after you have an infection or disease, your health care provider may do tests to monitor your condition and find the reason why the glands are still swollen and enlarged.  Check your affected lymph glands every day for changes. Check other lymph gland areas as told by your health care provider. This information is not intended to replace advice given to you by your health care provider. Make sure you discuss any questions you have with your health care provider. Document Released: 09/02/2008 Document Revised: 10/09/2017 Document Reviewed: 10/09/2017 Elsevier Interactive Patient Education  2019 [employer]. Doxycycline tablets or capsules What is this medicine? DOXYCYCLINE (dox i SYE kleen) is a tetracycline antibiotic. It kills certain bacteria or stops their growth. It is used to treat many kinds of infections, like dental, skin, respiratory, and urinary tract infections. It also treats acne, Lyme disease, malaria, and certain sexually transmitted  infections. This medicine may be used for other purposes; ask your health care provider or pharmacist if you have questions. COMMON BRAND NAME(S): Acticlate, Adoxa, Adoxa CK, Adoxa Pak, Adoxa TT, Alodox, Avidoxy, Doxal, LYMEPAK, Mondoxyne NL, Monodox, Morgidox 1x, Morgidox 1x Kit, Morgidox 2x, Morgidox 2x Kit, NutriDox, Ocudox, TARGADOX, Vibra-Tabs, Vibramycin What should I tell my health care provider before I take this medicine? They need to know if you have any of these conditions: -liver disease -long exposure to sunlight like working outdoors -stomach problems like colitis -an unusual or allergic reaction to doxycycline, tetracycline antibiotics, other medicines, foods, dyes, or preservatives -pregnant or trying to get pregnant -breast-feeding How should I use this medicine? Take this medicine by mouth with a full glass of water. Follow the directions on the prescription label. It is best to take this medicine without food, but if it upsets your stomach take it with food. Take your medicine at regular intervals. Do not take your medicine more often than directed. Take all of your medicine as directed even if you think you are better. Do not skip doses or stop your medicine early. Talk to your pediatrician regarding the use of this medicine in children. While this drug may be prescribed for selected conditions, precautions do apply. Overdosage: If you think you have taken too much of this medicine contact a poison control center or emergency room at once. NOTE: This medicine is only for you. Do not share this medicine with others. What if I miss a dose? If you miss a dose, take it as soon as you can. If it is almost time for your next dose, take only that dose. Do not take double or extra doses. What may interact with this medicine? -antacids -barbiturates -birth control pills -bismuth subsalicylate -carbamazepine -methoxyflurane -other antibiotics -phenytoin -vitamins that contain  iron -warfarin This list may not describe all possible interactions. Give your health care provider a list of all the medicines, herbs, non-prescription drugs, or dietary supplements you use. Also tell them if you smoke, drink alcohol, or use illegal drugs. Some items may interact with your medicine. What should I watch for while using this medicine? Tell your doctor or health care professional if your symptoms do not improve. Do not treat diarrhea with over the counter products. Contact your doctor if you have diarrhea that lasts more than 2 days or if it is severe and watery. Do not take this medicine just before going to bed. It may not dissolve properly when you lay down and can cause pain in your throat.  Drink plenty of fluids while taking this medicine to also help reduce irritation in your throat. This medicine can make you more sensitive to the sun. Keep out of the sun. If you cannot avoid being in the sun, wear protective clothing and use sunscreen. Do not use sun lamps or tanning beds/booths. Birth control pills may not work properly while you are taking this medicine. Talk to your doctor about using an extra method of birth control. If you are being treated for a sexually transmitted infection, avoid sexual contact until you have finished your treatment. Your sexual partner may also need treatment. Avoid antacids, aluminum, calcium, magnesium, and iron products for 4 hours before and 2 hours after taking a dose of this medicine. If you are using this medicine to prevent malaria, you should still protect yourself from contact with mosquitos. Stay in screened-in areas, use mosquito nets, keep your body covered, and use an insect repellent. What side effects may I notice from receiving this medicine? Side effects that you should report to your doctor or health care professional as soon as possible: -allergic reactions like skin rash, itching or hives, swelling of the face, lips, or tongue  -difficulty breathing -fever -itching in the rectal or genital area -pain on swallowing -redness, blistering, peeling or loosening of the skin, including inside the mouth -severe stomach pain or cramps -unusual bleeding or bruising -unusually weak or tired -yellowing of the eyes or skin Side effects that usually do not require medical attention (report to your doctor or health care professional if they continue or are bothersome): -diarrhea -loss of appetite -nausea, vomiting This list may not describe all possible side effects. Call your doctor for medical advice about side effects. You may report side effects to FDA at 1-800-FDA-1088. Where should I keep my medicine? Keep out of the reach of children. Store at room temperature, below 30 degrees C (86 degrees F). Protect from light. Keep container tightly closed. Throw away any unused medicine after the expiration date. Taking this medicine after the expiration date can make you seriously ill. NOTE: This sheet is a summary. It may not cover all possible information. If you have questions about this medicine, talk to your doctor, pharmacist, or health care provider.  2019 Elsevier/Gold Standard (2015-12-26 17:11:22)

## 2019-05-17 ENCOUNTER — Telehealth: Payer: Self-pay | Admitting: Adult Health

## 2019-05-17 LAB — B. BURGDORFI ANTIBODIES: Lyme IgG/IgM Ab: <0.91 {ISR} (ref 0.00–0.90)

## 2019-05-17 NOTE — Telephone Encounter (Signed)
Patient was called with his lab results.  All lab results were normal absolute monocytes were 1.0.  This was mildly elevated.  Patient reports that he is doing much better and that he has been icing and resting the area and feels as if his swelling is going down.  He denies any fever chills nausea vomiting and reports he is feeling better.  He will add 800 mg of ibuprofen every 8 hours until his recheck as well as continue taking his doxycycline.  He is advised that he may take Benadryl 25 to 50 mg per package instructions. Advised patient call the office or your primary care doctor for an appointment if no improvement within 72 hours or if any symptoms change or worsen at any time  Advised ER or urgent Care if after hours or on weekend. Call 911 for emergency symptoms at any time.Patinet verbalized understanding of all instructions given/reviewed and treatment plan and has no further questions or concerns at this time.     Patient verbalized understanding of all instructions given and denies any further questions at this time.

## 2019-05-18 NOTE — Progress Notes (Signed)
Called patient to inform negative lyme titer. He reports he is doing much better today. Denies any new symptoms.  Advised patient call the office or your primary care doctor for an appointment if no improvement within 72 hours or if any symptoms change or worsen at any time  Advised ER or urgent Care if after hours or on weekend. Call 911 for emergency symptoms at any time.Patinet verbalized understanding of all instructions given/reviewed and treatment plan and has no further questions or concerns at this time.

## 2019-05-19 ENCOUNTER — Other Ambulatory Visit: Payer: Self-pay

## 2019-05-19 ENCOUNTER — Encounter: Payer: Self-pay | Admitting: Adult Health

## 2019-05-19 ENCOUNTER — Ambulatory Visit: Payer: Managed Care, Other (non HMO) | Admitting: Adult Health

## 2019-05-19 VITALS — BP 122/76 | HR 74 | Temp 98.5°F | Resp 16 | Ht 71.0 in | Wt 240.0 lb

## 2019-05-19 DIAGNOSIS — W57XXXA Bitten or stung by nonvenomous insect and other nonvenomous arthropods, initial encounter: Secondary | ICD-10-CM | POA: Diagnosis not present

## 2019-05-19 DIAGNOSIS — Z8249 Family history of ischemic heart disease and other diseases of the circulatory system: Secondary | ICD-10-CM

## 2019-05-19 DIAGNOSIS — Z0189 Encounter for other specified special examinations: Secondary | ICD-10-CM | POA: Diagnosis not present

## 2019-05-19 DIAGNOSIS — Z008 Encounter for other general examination: Secondary | ICD-10-CM

## 2019-05-19 NOTE — Patient Instructions (Addendum)
Patient has family history and desired information he will discuss any screening with his primary care provider in the next to weeks.  Abdominal Aortic Aneurysm  An aneurysm is a bulge in one of the blood vessels that carry blood away from the heart (artery). It happens when blood pushes up against a weak or damaged place in the wall of an artery. An abdominal aortic aneurysm happens in the main artery of the body (aorta). Some aneurysms may not cause problems. If it grows, it can burst or tear, causing bleeding inside the body. This is an emergency. It needs to be treated right away. What are the causes? The exact cause of this condition is not known. What increases the risk? The following may make you more likely to get this condition:  Being a male who is 43 years of age or older.  Being white (Caucasian).  Using tobacco.  Having a family history of aneurysms.  Having the following conditions: ? Hardening of the arteries (arteriosclerosis). ? Inflammation of the walls of an artery (arteritis). ? Certain genetic conditions. ? Being very overweight (obesity). ? An infection in the wall of the aorta (infectious aortitis). ? High cholesterol. ? High blood pressure (hypertension). What are the signs or symptoms? Symptoms depend on the size of the aneurysm and how fast it is growing. Most grow slowly and do not cause any symptoms. If symptoms do occur, they may include:  Pain in the belly (abdomen), side, or back.  Feeling full after eating only small amounts of food.  Feeling a throbbing lump in the belly. Symptoms that the aneurysm has burst (ruptured) include:  Sudden, very bad pain in the belly, side, or back.  Feeling sick to your stomach (nauseous).  Throwing up (vomiting).  Feeling light-headed or passing out. How is this treated? Treatment for this condition depends on:  The size of the aneurysm.  How fast it is growing.  Your age.  Your risk of having  it burst. If your aneurysm is smaller than 2 inches (5 cm), your doctor may manage it by:  Checking it often to see if it is getting bigger. You may have an imaging test (ultrasound) to check it every 3-6 months, every year, or every few years.  Giving you medicines to: ? Control blood pressure. ? Treat pain. ? Fight infection. If your aneurysm is larger than 2 inches (5 cm), you may need surgery to fix it. Follow these instructions at home: Lifestyle  Do not use any products that have nicotine or tobacco in them. This includes cigarettes, e-cigarettes, and chewing tobacco. If you need help quitting, ask your doctor.  Get regular exercise. Ask your doctor what types of exercise are best for you. Eating and drinking  Eat a heart-healthy diet. This includes eating plenty of: ? Fresh fruits and vegetables. ? Whole grains. ? Low-fat (lean) protein. ? Low-fat dairy products.  Avoid foods that are high in saturated fat and cholesterol. These foods include red meat and some dairy products.  Do not drink alcohol if: ? Your doctor tells you not to drink. ? You are pregnant, may be pregnant, or are planning to become pregnant.  If you drink alcohol: ? Limit how much you use to:  0-1 drink a day for women.  0-2 drinks a day for men. ? Be aware of how much alcohol is in your drink. In the [place] one drink equals any of these:  One typical bottle of beer (12  oz).  One-half glass of wine (5 oz).  One shot of hard liquor (1 oz). General instructions  Take over-the-counter and prescription medicines only as told by your doctor.  Keep your blood pressure within normal limits. Ask your doctor what your blood pressure should be.  Have your blood sugar (glucose) level and cholesterol levels checked regularly. Keep your blood sugar level and cholesterol levels within normal limits.  Avoid heavy lifting and activities that take a lot of effort. Ask your doctor what activities are safe  for you.  Keep all follow-up visits as told by your doctor. This is important. ? Talk to your doctor about regular screenings to see if the aneurysm is getting bigger. Contact a doctor if you:  Have pain in your belly, side, or back.  Have a throbbing feeling in your belly.  Have a family history of aneurysms. Get help right away if you:  Have sudden, bad pain in your belly, side, or back.  Feel sick to your stomach.  Throw up.  Have trouble pooping (constipation).  Have trouble peeing (urinating).  Feel light-headed.  Have a fast heart rate when you stand.  Have sweaty skin that is cold to the touch (clammy).  Have shortness of breath.  Have a fever. These symptoms may be an emergency. Do not wait to see if the symptoms will go away. Get medical help right away. Call your local emergency services (911 in the [place] Do not drive yourself to the hospital. Summary  An aneurysm is a bulge in one of the blood vessels that carry blood away from the heart (artery). Some aneurysms may not cause problems.  You may need to have yours checked often. If it grows, it can burst or tear. This causes bleeding inside the body. It needs to be treated right away.  Follow instructions from your doctor about healthy lifestyle changes.  Keep all follow-up visits as told by your doctor. This is important. This information is not intended to replace advice given to you by your health care provider. Make sure you discuss any questions you have with your health care provider. Document Released: 03/21/2013 Document Revised: 07/03/2018 Document Reviewed: 07/03/2018 Elsevier Interactive Patient Education  2019 [place]. Doxycycline tablets or capsules What is this medicine? DOXYCYCLINE (dox i SYE kleen) is a tetracycline antibiotic. It kills certain bacteria or stops their growth. It is used to treat many kinds of infections, like dental, skin, respiratory, and urinary tract infections. It  also treats acne, Lyme disease, malaria, and certain sexually transmitted infections. This medicine may be used for other purposes; ask your health care provider or pharmacist if you have questions. COMMON BRAND NAME(S): Acticlate, Adoxa, Adoxa CK, Adoxa Pak, Adoxa TT, Alodox, Avidoxy, Doxal, LYMEPAK, Mondoxyne NL, Monodox, Morgidox 1x, Morgidox 1x Kit, Morgidox 2x, Morgidox 2x Kit, NutriDox, Ocudox, TARGADOX, Vibra-Tabs, Vibramycin What should I tell my health care provider before I take this medicine? They need to know if you have any of these conditions: -liver disease -long exposure to sunlight like working outdoors -stomach problems like colitis -an unusual or allergic reaction to doxycycline, tetracycline antibiotics, other medicines, foods, dyes, or preservatives -pregnant or trying to get pregnant -breast-feeding How should I use this medicine? Take this medicine by mouth with a full glass of water. Follow the directions on the prescription label. It is best to take this medicine without food, but if it upsets your stomach take it with food. Take your medicine at regular intervals. Do not take  your medicine more often than directed. Take all of your medicine as directed even if you think you are better. Do not skip doses or stop your medicine early. Talk to your pediatrician regarding the use of this medicine in children. While this drug may be prescribed for selected conditions, precautions do apply. Overdosage: If you think you have taken too much of this medicine contact a poison control center or emergency room at once. NOTE: This medicine is only for you. Do not share this medicine with others. What if I miss a dose? If you miss a dose, take it as soon as you can. If it is almost time for your next dose, take only that dose. Do not take double or extra doses. What may interact with this medicine? -antacids -barbiturates -birth control pills -bismuth subsalicylate -carbamazepine  -methoxyflurane -other antibiotics -phenytoin -vitamins that contain iron -warfarin This list may not describe all possible interactions. Give your health care provider a list of all the medicines, herbs, non-prescription drugs, or dietary supplements you use. Also tell them if you smoke, drink alcohol, or use illegal drugs. Some items may interact with your medicine. What should I watch for while using this medicine? Tell your doctor or health care professional if your symptoms do not improve. Do not treat diarrhea with over the counter products. Contact your doctor if you have diarrhea that lasts more than 2 days or if it is severe and watery. Do not take this medicine just before going to bed. It may not dissolve properly when you lay down and can cause pain in your throat. Drink plenty of fluids while taking this medicine to also help reduce irritation in your throat. This medicine can make you more sensitive to the sun. Keep out of the sun. If you cannot avoid being in the sun, wear protective clothing and use sunscreen. Do not use sun lamps or tanning beds/booths. Birth control pills may not work properly while you are taking this medicine. Talk to your doctor about using an extra method of birth control. If you are being treated for a sexually transmitted infection, avoid sexual contact until you have finished your treatment. Your sexual partner may also need treatment. Avoid antacids, aluminum, calcium, magnesium, and iron products for 4 hours before and 2 hours after taking a dose of this medicine. If you are using this medicine to prevent malaria, you should still protect yourself from contact with mosquitos. Stay in screened-in areas, use mosquito nets, keep your body covered, and use an insect repellent. What side effects may I notice from receiving this medicine? Side effects that you should report to your doctor or health care professional as soon as possible: -allergic reactions like  skin rash, itching or hives, swelling of the face, lips, or tongue -difficulty breathing -fever -itching in the rectal or genital area -pain on swallowing -redness, blistering, peeling or loosening of the skin, including inside the mouth -severe stomach pain or cramps -unusual bleeding or bruising -unusually weak or tired -yellowing of the eyes or skin Side effects that usually do not require medical attention (report to your doctor or health care professional if they continue or are bothersome): -diarrhea -loss of appetite -nausea, vomiting This list may not describe all possible side effects. Call your doctor for medical advice about side effects. You may report side effects to FDA at 1-800-FDA-1088. Where should I keep my medicine? Keep out of the reach of children. Store at room temperature, below 30 degrees C (86 degrees  F). Protect from light. Keep container tightly closed. Throw away any unused medicine after the expiration date. Taking this medicine after the expiration date can make you seriously ill. NOTE: This sheet is a summary. It may not cover all possible information. If you have questions about this medicine, talk to your doctor, pharmacist, or health care provider.  2019 Elsevier/Gold Standard (2015-12-26 17:11:22) Tick Bite Information, Adult  Ticks are insects that can bite. Most ticks live in shrubs and grassy areas. They climb onto people and animals that go by. Then they bite. Some ticks carry germs that can make you sick. How can I prevent tick bites?  Use an insect repellent that has 20% or higher of the ingredients DEET, picaridin, or IR3535. Put this insect repellent on: ? Bare skin. ? The tops of your boots. ? Your pant legs. ? The ends of your sleeves.  If you use an insect repellent that has the ingredient permethrin, make sure to follow the instructions on the bottle. Treat the following: ? Clothing. ? Supplies. ? Boots. ? Tents.  Wear long sleeves,  long pants, and light colors.  Tuck your pant legs into your socks.  Stay in the middle of the trail.  Try not to walk through long grass.  Before going inside your house, check your clothes, hair, and skin for ticks. Make sure to check your head, neck, armpits, waist, groin, and joint areas.  Check for ticks every day.  When you come indoors: ? Wash your clothes right away. ? Shower right away. ? Dry your clothes in a dryer on high heat for 60 minutes or more. What is the right way to remove a tick? Remove a tick from your skin as soon as possible.  To remove a tick that is crawling on your skin: ? Go outdoors and brush the tick off. ? Use tape or a lint roller.  To remove a tick that is biting: ? Wash your hands. ? If you have latex gloves, put them on. ? Use tweezers, curved forceps, or a tick-removal tool to grasp the tick. Grasp the tick as close to your skin and as close to the tick's head as possible. ? Gently pull up until the tick lets go.  Try to keep the tick's head attached to its body.  Do not twist or jerk the tick.  Do not squeeze or crush the tick. Do not try to remove a tick with heat, alcohol, petroleum jelly, or fingernail polish. How should I get rid of a tick? Here are some ways to get rid of a tick that is alive:  Place the tick in rubbing alcohol.  Place the tick in a bag or container you can close tightly.  Wrap the tick tightly in tape.  Flush the tick down the toilet. Contact a doctor if:  You have symptoms of a disease, such as: ? Pain in a muscle, joint, or bone. ? Trouble walking or moving your legs. ? Numbness in your legs. ? Inability to move (paralysis). ? A red rash that makes a circle (bull's-eye rash). ? Redness and swelling where the tick bit you. ? A fever. ? Throwing up (vomiting) over and over. ? Diarrhea. ? Weight loss. ? Tender and swollen lymph glands. ? Shortness of breath. ? Cough. ? Belly pain (abdominal pain).  ? Headache. ? Being more tired than normal. ? A change in how alert (conscious) you are. ? Confusion. Get help right away if:  You cannot remove  a tick.  A part of a tick breaks off and gets stuck in your skin.  You are feeling worse. Summary  Ticks may carry germs that can make you sick.  To prevent tick bites, wear long sleeves, long pants, and light colors. Use insect repellent. Follow the instructions on the bottle.  If the tick is biting, do not try to remove it with heat, alcohol, petroleum jelly, or fingernail polish.  Use tweezers, curved forceps, or a tick-removal tool to grasp the tick. Gently pull up until the tick lets go. Do not twist or jerk the tick. Do not squeeze or crush the tick.  If you have symptoms, contact a doctor. This information is not intended to replace advice given to you by your health care provider. Make sure you discuss any questions you have with your health care provider. Document Released: 02/18/2010 Document Revised: 03/06/2017 Document Reviewed: 03/06/2017 Elsevier Interactive Patient Education  2019 [employer].

## 2019-05-19 NOTE — Progress Notes (Signed)
[facility]  Della GooJames M Seyler DOB: 43 y.o. MRN: [id number]  Subjective:  Here for Biometric Screen/brief exam Patient is a 43 year old male in no acute distress who comes to the clinic for his biometric. 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  He also was seen on 05/16/19 for  Tick bite to his right testicle and right groin erythema and adenopathy.  He is being treated with Doxycycline 100 mg BID oral for 14 days.He states " everything has gone back to normal, no swelling, no redness and no pain you do not even need to recheck it I am back to normal"  He does have concern that his father had a triple Aortic aneurysm at 4482 and he recently found out his paternal uncle also has the same diagnosis. He will follow up with his Danella PentonMiller, Mark F, MD PCP for this and regarding having a screening done due to family history. He reports he sees his primary care in around two weeks.  He denies any symptoms.  No abdominal pain, no shortness of breath, no back pain.   Patient  denies any fever, body aches,chills, rash, chest pain, shortness of breath, nausea, vomiting, or diarrhea.    Objective: Blood pressure 122/76, pulse 74, temperature 98.5 F (36.9 C), resp. rate 16, height 5\' 11"  (1.803 m), weight 240 lb (108.9 kg), SpO2 97 %. NAD HEENT: Within normal limits Neck: Normal, neck , no carotid bruits bilaterally  Heart: Regular rate and rhythm Lungs: Clear Abdomen: soft, non tender, no abnormalities.   He declined recommended  recheck testicle/ and right groin adenopathy Assessment: Biometric screen Continue as below  1. Encounter for biometric screening   2. Family history of abdominal aortic aneurysm (AAA)   3. Tick bite with right groin adenopathy follow up - resolved per patient      Plan: Recommend recheck of groin- patient declined as above he does have a physical with Danella PentonMiller, Mark F, MD upcoming. He assures provider he will call the office should  any symptom occur again.   He will also discuss paternal history with Dr. Hyacinth MeekerMiller ( provider added this to family history)  of AAA in his father and paternal uncle and have possible screening done due to family history.   Discussed RED Flags.  Finish Doxycycline as prescribed.   Fasting glucose and lipids. Discussed with patient that today's visit here is a limited biometric screening visit (not a comprehensive exam or management of any chronic problems) Discussed some health issues, including healthy eating habits and exercise. Encouraged to follow-up with PCP for annual comprehensive preventive and wellness care (and if applicable, any chronic issues). Questions invited and answered.   I will have the office call you on your glucose and cholesterol results when they return if you have not heard within 1 week please call the office.  This biometric physical is a brief physical and the only labs done are glucose and your lipid panel(cholesterol) and is  not a substitute for seeing a primary care provider for a complete annual physical. Please see a primary care physician for routine health maintenance, labs and full physical at least yearly and follow up as recommended by your provider. Provider also recommends if you do not have a primary care provider for patient to establish care as soon as possible .Patient may chose provider of choice. Also gave the [facility]  PHYSICIAN/PROVIDER  REFERRAL LINE at [phone] or web site at [email or web address] to  help assist with finding a primary care doctor.  Patient verbalizes understanding that his office is acute care only and not a substitute for a primary care or for the management of chronic conditions.   An After Visit Summary was printed and given to the patient.  Follow up with primary care as needed for chronic and maintenance health care- can be seen in this employee clinic for acute care.

## 2019-05-20 LAB — LIPID PANEL WITH LDL/HDL RATIO
Cholesterol, Total: 204 mg/dL — ABNORMAL HIGH (ref 100–199)
HDL: 39 mg/dL — ABNORMAL LOW (ref >39)
LDL Calculated: 152 mg/dL — ABNORMAL HIGH (ref 0–99)
LDl/HDL Ratio: 3.9 ratio — ABNORMAL HIGH (ref 0.0–3.6)
Triglycerides: 66 mg/dL (ref 0–149)
VLDL Cholesterol Cal: 13 mg/dL (ref 5–40)

## 2019-05-20 LAB — GLUCOSE, RANDOM: Glucose: 88 mg/dL (ref 65–99)

## 2019-05-23 NOTE — Progress Notes (Signed)
Nicholas Russell.  Will you let Nicholas Russell know that his blood glucose is normal at 88. Total cholesterol is 204 should be less than 199. His HDL good cholesterol is 39 we like this number to be over 39, the higher the better. LDL is bad cholesterol is 152, this number should be less than 99. To reduce the risk of heart disease I recommend a heart healthy diet, low saturated fat, and decreased processed foods with exercise. Follow-up with Dr. Sabra Heck as discussed for your full physical. Thanks, Laverna Peace MSN, AGNP-C, FNP-C

## 2019-10-03 ENCOUNTER — Other Ambulatory Visit: Payer: Self-pay

## 2020-06-12 ENCOUNTER — Encounter: Payer: Self-pay | Admitting: Physician Assistant

## 2020-06-12 ENCOUNTER — Ambulatory Visit: Payer: Managed Care, Other (non HMO) | Admitting: Physician Assistant

## 2020-06-12 ENCOUNTER — Other Ambulatory Visit: Payer: Self-pay

## 2020-06-12 VITALS — BP 136/80 | HR 76 | Temp 98.0°F | Resp 16 | Ht 71.0 in | Wt 260.0 lb

## 2020-06-12 DIAGNOSIS — Z0000 Encounter for general adult medical examination without abnormal findings: Secondary | ICD-10-CM

## 2020-06-12 DIAGNOSIS — Z008 Encounter for other general examination: Secondary | ICD-10-CM | POA: Diagnosis not present

## 2020-06-12 NOTE — Progress Notes (Signed)
   Subjective:    Patient ID: Nicholas Russell, male    DOB: 05-Apr-1976, 44 y.o.   MRN: [id number]  HPI    Review of Systems     Objective:   Physical Exam        Assessment & Plan:

## 2020-06-12 NOTE — Progress Notes (Signed)
Subjective:    Patient ID: Nicholas Russell, male    DOB: [birth year], 44 y.o.   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 Also did weight lifting and body building.   Reports that he works out in gym every day for about 30 minutes on circuits . Uses series of machines and bicycle Has done some running and walking with wife Has experienced rapid weight loss in past ( school days) when needed for coaches requirements - Feels he doesn't look healthy under 240 - but has been pleased on occasion to get there.  Keeps "in shape " attitude and likes to challenge new recruits  ( 5 '11"   275 today )  Presents concern about being in his 35s and realizing that multiple adult males in his family tree have had cardiac events in 78s-50s, including father and brother. Mothers' side of family small and slender  Denies chest pain, irregular pulse, exaggerated SOB during workouts, or failure to return to baseline quickly.  Has seasonal allergies and takes sporadic Ceterizine 1 BID- does not use Fluticasone. Has significant snoring issues , worse during pollen season- wife has recorded it before to "prove" it to him  Wife has gluten free diet. Vansh cooks for family and does Her  diet separate from the his own and for  the children-  Making joint serving for both categories when possible.  Review of Systems Snoring needs to be discussed with PCP for consideration of Sleep study evaluation  Chart review reports AAA hx in family memebers    S/P ACL x 2 on right -wrestling Objective:   Physical Exam Vitals and nursing note reviewed.  Constitutional:      General: He is not in acute distress.    Appearance: Normal appearance. He is obese.  HENT:      Head: Normocephalic and atraumatic.     Right Ear: Tympanic membrane, ear canal and external ear normal.     Left Ear: Tympanic membrane, ear canal and external ear normal.     Nose: Nose normal.     Mouth/Throat:     Mouth: Mucous membranes are moist.     Comments: DDS every 6 months No PND or cobblestoning noted Eyes:     Extraocular Movements: Extraocular movements intact.     Conjunctiva/sclera: Conjunctivae normal.  Cardiovascular:     Rate and Rhythm: Normal rate and regular rhythm.     Pulses: Normal pulses.     Heart sounds: Normal heart sounds.  Pulmonary:     Effort: Pulmonary effort is normal.     Breath sounds: Normal breath sounds.  Abdominal:     General: Bowel sounds are normal.     Palpations: Abdomen is soft. There is no mass.     Tenderness: There is no abdominal tenderness. There is no guarding.     Comments: No midline tenderness elicited  Genitourinary:    Comments: Deferred. Denies concerns Musculoskeletal:        General: No tenderness. Normal range of motion.     Cervical back: Normal range of motion and neck supple. No tenderness.  Lymphadenopathy:     Cervical: No cervical adenopathy.  Skin:    General: Skin is warm and dry.  Capillary Refill: Capillary refill takes less than 2 seconds.  Neurological:     General: No focal deficit present.     Mental Status: He is alert.     Cranial Nerves: No cranial nerve deficit.     Deep Tendon Reflexes: Reflexes normal.  Psychiatric:        Mood and Affect: Mood normal.        Behavior: Behavior normal.       Assessment & Plan:  Discussed cardiovascular status (pending labs, BMI) as being the health guideline to follow as adult male, but particularly with family hx cardiac events. Will review his labs as available- have also encouraged him to discuss a cardiac consultation and sleep study with his PCP for family history and personal snoring. Weight loss is strongly encouraged though he has always  considered BIG with male/strength the focus needs to adjust to aging and family hx. He is very interested and asks appropriate questions. Reviewed basic teaching but recommened that he continue the conversation with PCP for guidance and direction long term. Have encouraged a trial of adding fluticasone 1 STEN QD/BID and adjust away the second po dose if tolerated. May use evening dose spray if eases night breathing.  He reports MyChart is active - will report labs as available.

## 2020-06-13 LAB — LIPID PANEL
Chol/HDL Ratio: 7.2 ratio — ABNORMAL HIGH (ref 0.0–5.0)
Cholesterol, Total: 252 mg/dL — ABNORMAL HIGH (ref 100–199)
HDL: 35 mg/dL — ABNORMAL LOW (ref >39)
LDL Chol Calc (NIH): 177 mg/dL — ABNORMAL HIGH (ref 0–99)
Triglycerides: 213 mg/dL — ABNORMAL HIGH (ref 0–149)
VLDL Cholesterol Cal: 40 mg/dL (ref 5–40)

## 2020-06-13 LAB — GLUCOSE, RANDOM: Glucose: 106 mg/dL — ABNORMAL HIGH (ref 65–99)

## 2020-06-19 ENCOUNTER — Telehealth: Payer: Self-pay | Admitting: Physician Assistant

## 2020-06-21 NOTE — Telephone Encounter (Signed)
Attempted to contact patient to discuss labs -- and let him know that My Chart connection had not ,to this point , been successful.  Has Bethann Punches MD as PCP so labs can be reviewed and discussed Will continue to attempt contact with patient  [facility] PA-C

## 2020-11-12 ENCOUNTER — Other Ambulatory Visit: Payer: Self-pay | Admitting: Internal Medicine

## 2020-11-12 DIAGNOSIS — R079 Chest pain, unspecified: Secondary | ICD-10-CM

## 2020-11-12 DIAGNOSIS — R1013 Epigastric pain: Secondary | ICD-10-CM

## 2020-11-19 ENCOUNTER — Other Ambulatory Visit: Payer: Self-pay

## 2020-11-19 ENCOUNTER — Ambulatory Visit
Admission: RE | Admit: 2020-11-19 | Discharge: 2020-11-19 | Disposition: A | Discharge status: Home / Self Care | Payer: Managed Care, Other (non HMO) | Source: Ambulatory Visit | Attending: Internal Medicine | Admitting: Internal Medicine

## 2020-11-19 DIAGNOSIS — R1013 Epigastric pain: Secondary | ICD-10-CM | POA: Diagnosis present

## 2020-11-19 DIAGNOSIS — R079 Chest pain, unspecified: Secondary | ICD-10-CM

## 2021-03-15 ENCOUNTER — Ambulatory Visit: Payer: Managed Care, Other (non HMO) | Admitting: Nurse Practitioner

## 2021-03-15 ENCOUNTER — Encounter: Payer: Self-pay | Admitting: Nurse Practitioner

## 2021-03-15 VITALS — BP 126/78 | HR 70 | Temp 98.0°F | Resp 16 | Ht 71.0 in | Wt 258.0 lb

## 2021-03-15 DIAGNOSIS — Z0000 Encounter for general adult medical examination without abnormal findings: Secondary | ICD-10-CM | POA: Diagnosis not present

## 2021-03-15 DIAGNOSIS — Z008 Encounter for other general examination: Secondary | ICD-10-CM

## 2021-03-15 NOTE — Progress Notes (Signed)
Subjective:    Patient ID: Nicholas Russell, male    DOB: 09-06-1976, 45 y.o.   MRN: [id number]  HPI Nicholas Russell is a 45 y.o. male who presents to the [facility] clinic for his annual biometric screening exam. 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 He has been in Patent examiner for 19 years. He enjoys his job. 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 Over the past couple years he has had considerable stress due to loss of both of his parents and both of his wife's parents. He saw Dr. Clide Cliff about 5 months ago and had a cardiac work up due to chest pressure and SOB. Patient states that findings were consistent with stress and grief reaction. 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 His symptoms have improved over the past 5 months since his visit with Dr. Clide Cliff.   Past Medical History:  Diagnosis Date  . Asthma   . Gout    Past Surgical History:  Procedure Laterality Date  . ANTERIOR CRUCIATE LIGAMENT REPAIR Right 2000, 2001   No current outpatient medications on file prior to visit.   No current facility-administered medications on file prior to visit.   Past Surgical History:  Procedure Laterality Date  . ANTERIOR CRUCIATE LIGAMENT REPAIR Right 2000, 2001   Immunizations: Has not had Covid vaccine. He did have flu vaccine this year. UTD tetanus.  Diet/Exercise: Problem with stress eating but trying to get on a more healthy diet, has lost 25 pounds. Recently started back to the gym 3 times a week.   Review of Systems  Musculoskeletal:       Occasional burning sensation left inner thigh.  Psychiatric/Behavioral:       Stress/anxiety s/p loss of family members and grieving process.   All other systems reviewed and are negative.      Objective: BP 126/78 (BP Location: Left Arm, Patient Position: Sitting, Cuff Size:  Normal)   Pulse 70   Temp 98 F (36.7 C) (Temporal)   Resp 16   Ht 5\' 11"  (1.803 m)   Wt 258 lb (117 kg)   SpO2 96%   BMI 35.98 kg/m     Physical Exam Vitals and nursing note reviewed.  Constitutional:      General: He is not in acute distress. HENT:     Head: Normocephalic and atraumatic.     Jaw: There is normal jaw occlusion.     Right Ear: Tympanic membrane, ear canal and external ear normal.     Left Ear: Tympanic membrane, ear canal and external ear normal.     Nose: Nose normal.     Mouth/Throat:     Mouth: Mucous membranes are moist.     Pharynx: Oropharynx is clear.  Eyes:     General: Lids are normal. No visual field deficit.    Extraocular Movements: Extraocular movements intact.     Conjunctiva/sclera: Conjunctivae normal.     Pupils: Pupils are equal, round, and reactive to light.  Neck:     Thyroid: No thyroid mass.     Vascular: Normal carotid pulses. No carotid bruit or JVD.     Trachea: Trachea normal.  Cardiovascular:     Rate and Rhythm: Normal rate and regular rhythm.     Pulses:  Carotid pulses are 2+ on the right side and 2+ on the left side.      Radial pulses are 2+ on the right side and 2+ on the left side.       Dorsalis pedis pulses are 2+ on the right side and 2+ on the left side.     Heart sounds: No murmur heard.   Pulmonary:     Effort: Pulmonary effort is normal.     Breath sounds: Normal breath sounds. No decreased air movement.  Abdominal:     General: Bowel sounds are normal.     Palpations: Abdomen is soft.     Tenderness: There is no abdominal tenderness. There is no right CVA tenderness or left CVA tenderness.  Musculoskeletal:        General: Normal range of motion.     Cervical back: Normal range of motion and neck supple. No spinous process tenderness or muscular tenderness.     Right lower leg: No edema.     Left lower leg: No edema.  Lymphadenopathy:     Cervical: No cervical adenopathy.  Skin:    General:  Skin is warm and dry.  Neurological:     Mental Status: He is alert.     Cranial Nerves: No facial asymmetry.     Sensory: Sensation is intact.     Motor: No weakness, atrophy or pronator drift.     Coordination: Romberg sign negative. Coordination normal. Finger-Nose-Finger Test and Heel to [facility] Test normal.     Gait: Gait is intact.     Deep Tendon Reflexes:     Reflex Scores:      Bicep reflexes are 2+ on the right side and 2+ on the left side.      Brachioradialis reflexes are 2+ on the right side and 2+ on the left side.      Patellar reflexes are 2+ on the right side and 2+ on the left side.    Comments: Ambulatory with steady gait. Stands on one foot without difficulty. Grips are equal, radial and pedal pulses 2+.   Psychiatric:        Attention and Perception: Attention normal.        Mood and Affect: Mood normal.        Speech: Speech normal.        Behavior: Behavior normal.       Assessment & Plan:  45 y.o. pleasant gentleman here today for his biometric screening exam. He is working on improving his diet and exercise program.  1. Encounter for biometric screening  2. Encounter for other general examination  3. Encounter for preventive health examination  I discussed with the patient past lab results with elevated cholesterol and triglycerides and diet/exercise plan. When today's lab results are back I have asked the patient to discuss with his PCP possibly medications plus diet/exercise if the numbers continue to rise. Patient given the opportunity to ask questions. All questions answered and patient voices understanding and agrees with plan.  He will return in one year or sooner for any problems.

## 2021-03-15 NOTE — Patient Instructions (Addendum)
Health Maintenance, Male Adopting a healthy lifestyle and getting preventive care are important in promoting health and wellness. Ask your health care provider about:  The right schedule for you to have regular tests and exams.  Things you can do on your own to prevent diseases and keep yourself healthy. What should I know about diet, weight, and exercise? Eat a healthy diet  Eat a diet that includes plenty of vegetables, fruits, low-fat dairy products, and lean protein.  Do not eat a lot of foods that are high in solid fats, added sugars, or sodium.   Maintain a healthy weight Body mass index (BMI) is a measurement that can be used to identify possible weight problems. It estimates body fat based on height and weight. Your health care provider can help determine your BMI and help you achieve or maintain a healthy weight. Get regular exercise Get regular exercise. This is one of the most important things you can do for your health. Most adults should:  Exercise for at least 150 minutes each week. The exercise should increase your heart rate and make you sweat (moderate-intensity exercise).  Do strengthening exercises at least twice a week. This is in addition to the moderate-intensity exercise.  Spend less time sitting. Even light physical activity can be beneficial. Watch cholesterol and blood lipids Have your blood tested for lipids and cholesterol at 45 years of age, then have this test every 5 years. You may need to have your cholesterol levels checked more often if:  Your lipid or cholesterol levels are high.  You are older than 45 years of age.  You are at high risk for heart disease. What should I know about cancer screening? Many types of cancers can be detected early and may often be prevented. Depending on your health history and family history, you may need to have cancer screening at various ages. This may include screening for:  Colorectal cancer.  Prostate  cancer.  Skin cancer.  Lung cancer. What should I know about heart disease, diabetes, and high blood pressure? Blood pressure and heart disease  High blood pressure causes heart disease and increases the risk of stroke. This is more likely to develop in people who have high blood pressure readings, are of African descent, or are overweight.  Talk with your health care provider about your target blood pressure readings.  Have your blood pressure checked: ? Every 3-5 years if you are 55-22 years of age. ? Every year if you are 41 years old or older.  If you are between the ages of 16 and 56 and are a current or former smoker, ask your health care provider if you should have a one-time screening for abdominal aortic aneurysm (AAA). Diabetes Have regular diabetes screenings. This checks your fasting blood sugar level. Have the screening done:  Once every three years after age 70 if you are at a normal weight and have a low risk for diabetes.  More often and at a younger age if you are overweight or have a high risk for diabetes. What should I know about preventing infection? Hepatitis B If you have a higher risk for hepatitis B, you should be screened for this virus. Talk with your health care provider to find out if you are at risk for hepatitis B infection. Hepatitis C Blood testing is recommended for:  Everyone born from [birth year] through 1965.  Anyone with known risk factors for hepatitis C. Sexually transmitted infections (STIs)  You should be screened  each year for STIs, including gonorrhea and chlamydia, if: ? You are sexually active and are younger than 45 years of age. ? You are older than 45 years of age and your health care provider tells you that you are at risk for this type of infection. ? Your sexual activity has changed since you were last screened, and you are at increased risk for chlamydia or gonorrhea. Ask your health care provider if you are at risk.  Ask your  health care provider about whether you are at high risk for HIV. Your health care provider may recommend a prescription medicine to help prevent HIV infection. If you choose to take medicine to prevent HIV, you should first get tested for HIV. You should then be tested every 3 months for as long as you are taking the medicine. Follow these instructions at home: Lifestyle  Do not use any products that contain nicotine or tobacco, such as cigarettes, e-cigarettes, and chewing tobacco. If you need help quitting, ask your health care provider.  Do not use street drugs.  Do not share needles.  Ask your health care provider for help if you need support or information about quitting drugs. Alcohol use  Do not drink alcohol if your health care provider tells you not to drink.  If you drink alcohol: ? Limit how much you have to 0-2 drinks a day. ? Be aware of how much alcohol is in your drink. In the [place] one drink equals one 12 oz bottle of beer (355 mL), one 5 oz glass of wine (148 mL), or one 1 oz glass of hard liquor (44 mL). General instructions  Schedule regular health, dental, and eye exams.  Stay current with your vaccines.  Tell your health care provider if: ? You often feel depressed. ? You have ever been abused or do not feel safe at home. Summary  Adopting a healthy lifestyle and getting preventive care are important in promoting health and wellness.  Follow your health care provider's instructions about healthy diet, exercising, and getting tested or screened for diseases.  Follow your health care provider's instructions on monitoring your cholesterol and blood pressure. This information is not intended to replace advice given to you by your health care provider. Make sure you discuss any questions you have with your health care provider. Document Revised: 11/17/2018 Document Reviewed: 11/17/2018 Elsevier Patient Education  2021 [employer]  High Cholesterol  High  cholesterol is a condition in which the blood has high levels of a white, waxy substance similar to fat (cholesterol). The liver makes all the cholesterol that the body needs. The human body needs small amounts of cholesterol to help build cells. A person gets extra or excess cholesterol from the food that he or she eats. The blood carries cholesterol from the liver to the rest of the body. If you have high cholesterol, deposits (plaques) may build up on the walls of your arteries. Arteries are the blood vessels that carry blood away from your heart. These plaques make the arteries narrow and stiff. Cholesterol plaques increase your risk for heart attack and stroke. Work with your health care provider to keep your cholesterol levels in a healthy range. What increases the risk? The following factors may make you more likely to develop this condition:  Eating foods that are high in animal fat (saturated fat) or cholesterol.  Being overweight.  Not getting enough exercise.  A family history of high cholesterol (familial hypercholesterolemia).  Use of tobacco products.  Having diabetes. What are the signs or symptoms? There are no symptoms of this condition. How is this diagnosed? This condition may be diagnosed based on the results of a blood test.  If you are older than 45 years of age, your health care provider may check your cholesterol levels every 4-6 years.  You may be checked more often if you have high cholesterol or other risk factors for heart disease. The blood test for cholesterol measures:  "Bad" cholesterol, or LDL cholesterol. This is the main type of cholesterol that causes heart disease. The desired level is less than 100 mg/dL.  "Good" cholesterol, or HDL cholesterol. HDL helps protect against heart disease by cleaning the arteries and carrying the LDL to the liver for processing. The desired level for HDL is 60 mg/dL or higher.  Triglycerides. These are fats that your  body can store or burn for energy. The desired level is less than 150 mg/dL.  Total cholesterol. This measures the total amount of cholesterol in your blood and includes LDL, HDL, and triglycerides. The desired level is less than 200 mg/dL. How is this treated? This condition may be treated with:  Diet changes. You may be asked to eat foods that have more fiber and less saturated fats or added sugar.  Lifestyle changes. These may include regular exercise, maintaining a healthy weight, and quitting use of tobacco products.  Medicines. These are given when diet and lifestyle changes have not worked. You may be prescribed a statin medicine to help lower your cholesterol levels. Follow these instructions at home: Eating and drinking  Eat a healthy, balanced diet. This diet includes: ? Daily servings of a variety of fresh, frozen, or canned fruits and vegetables. ? Daily servings of whole grain foods that are rich in fiber. ? Foods that are low in saturated fats and trans fats. These include poultry and fish without skin, lean cuts of meat, and low-fat dairy products. ? A variety of fish, especially oily fish that contain omega-3 fatty acids. Aim to eat fish at least 2 times a week.  Avoid foods and drinks that have added sugar.  Use healthy cooking methods, such as roasting, grilling, broiling, baking, poaching, steaming, and stir-frying. Do not fry your food except for stir-frying.   Lifestyle  Get regular exercise. Aim to exercise for a total of 150 minutes a week. Increase your activity level by doing activities such as gardening, walking, and taking the stairs.  Do not use any products that contain nicotine or tobacco, such as cigarettes, e-cigarettes, and chewing tobacco. If you need help quitting, ask your health care provider.   General instructions  Take over-the-counter and prescription medicines only as told by your health care provider.  Keep all follow-up visits as told by  your health care provider. This is important. Where to find more information  American Heart Association: www.heart.org  National Heart, Lung, and Blood Institute: PopSteam.is Contact a health care provider if:  You have trouble achieving or maintaining a healthy diet or weight.  You are starting an exercise program.  You are unable to stop smoking. Get help right away if:  You have chest pain.  You have trouble breathing.  You have any symptoms of a stroke. "BE FAST" is an easy way to remember the main warning signs of a stroke: ? B - Balance. Signs are dizziness, sudden trouble walking, or loss of balance. ? E - Eyes. Signs are trouble seeing or a sudden change in vision. ?  F - Face. Signs are sudden weakness or numbness of the face, or the face or eyelid drooping on one side. ? A - Arms. Signs are weakness or numbness in an arm. This happens suddenly and usually on one side of the body. ? S - Speech. Signs are sudden trouble speaking, slurred speech, or trouble understanding what people say. ? T - Time. Time to call emergency services. Write down what time symptoms started.  You have other signs of a stroke, such as: ? A sudden, severe headache with no known cause. ? Nausea or vomiting. ? Seizure. These symptoms may represent a serious problem that is an emergency. Do not wait to see if the symptoms will go away. Get medical help right away. Call your local emergency services (911 in the [place] Do not drive yourself to the hospital. Summary  Cholesterol plaques increase your risk for heart attack and stroke. Work with your health care provider to keep your cholesterol levels in a healthy range.  Eat a healthy, balanced diet, get regular exercise, and maintain a healthy weight.  Do not use any products that contain nicotine or tobacco, such as cigarettes, e-cigarettes, and chewing tobacco.  Get help right away if you have any symptoms of a stroke. This information is  not intended to replace advice given to you by your health care provider. Make sure you discuss any questions you have with your health care provider. Document Revised: 10/24/2019 Document Reviewed: 10/24/2019 Elsevier Patient Education  2021 Else[employer] Inc.  Triglycerides Test Why am I having this test? Triglycerides are a type of fat in the body. Having a high level of triglycerides can increase your risk for heart disease. You may have this test as part of a routine physical exam. Health care providers recommend that adults have this test at least once every 5 years. If you have risk factors for heart disease or are being treated for high triglycerides, you may need to have this test more often. What is being tested? This test measures the amount of triglycerides in your blood. Triglycerides are naturally present in the body, and you also take in triglycerides by eating certain foods. Triglycerides may be measured as part of a test called a lipid profile, which tests triglycerides and cholesterol levels. What kind of sample is taken? A blood sample is required for this test. It may be collected by inserting a needle into a blood vessel, or by pricking a fingertip with a small needle (finger stick).   How do I prepare for this test?  Follow instructions from your health care provider about changing or stopping your regular medicines.  Do not eat or drink anything except water starting 9-12 hours before your test, or as long as told by your health care provider.  Do not drink alcohol starting at least 24 hours before your test.  Follow any instructions from your health care provider about dietary restrictions before your test. Tell a health care provider about:  All medicines you are taking, including vitamins, herbs, eye drops, creams, and over-the-counter medicines.  Any blood disorders you have.  Any medical conditions you have. How are the results reported? Your test results will be  reported as a value that indicates how many triglycerides are in your blood. This will be given as milligrams of triglycerides per deciliter of blood (mg/dL). Your health care provider will compare your results to normal values that were established after testing a large group of people (reference ranges). Reference ranges  may vary among labs and hospitals. For this test, common reference ranges are:  Adults: ? Male: 40-160 mg/dL or 1.61-0.96 mmol/L (SI units). ? Male: 35-135 mg/dL or 0.45-4.09 mmol/L (SI units).  Teens 29-60 years old: ? Male: 40-163 mg/dL. ? Male: 40-128 mg/dL.  Children 19-45 years old: ? Male: 36-138 mg/dL. ? Male: 41-138 mg/dL.  Children 11-52 years old: ? Male: 31-108 mg/dL. ? Male: 35-114 mg/dL.  Children 5 years or younger: ? Male: 30-86 mg/dL. ? Male: 32-99 mg/dL. What do the results mean? Results that are within the reference range are considered normal. This means that you have a normal amount of triglycerides in your blood. Results that are higher than your reference range mean that there are too many triglycerides in your blood. This may mean that you:  Have a higher risk of heart disease.  Have certain diseases that cause high triglycerides, such as diabetes.  Are taking certain medicines such as estrogens and oral contraceptives. Results that are lower than your reference range mean that there are too few triglycerides in your blood. This may mean that you are not getting enough nutrients in your diet (malnutrition). Talk with your health care provider about what your results mean. Questions to ask your health care provider Ask your health care provider, or the department that is doing the test:  When will my results be ready?  How will I get my results?  What are my treatment options?  What other tests do I need?  What are my next steps? Summary  Triglycerides are a type of fat in the body. Having a high level of triglycerides  can increase your risk for heart disease.  You may have this test as part of a routine physical exam. Triglycerides may be measured as part of a test called a lipid profile, which tests triglycerides and cholesterol.  Talk with your health care provider about what your results mean. This information is not intended to replace advice given to you by your health care provider. Make sure you discuss any questions you have with your health care provider. Document Revised: 03/14/2020 Document Reviewed: 03/14/2020 Elsevier Patient Education  2021 [employer] Inc.  High Triglycerides Eating Plan Triglycerides are a type of fat in the blood. High levels of triglycerides can increase your risk of heart disease and stroke. If your triglyceride levels are high, choosing the right foods can help lower your triglycerides and keep your heart healthy. Work with your health care provider or a diet and nutrition specialist (dietitian) to develop an eating plan that is right for you. What are tips for following this plan? General guidelines  Lose weight, if you are overweight. For most people, losing 5-10 lbs (2-5 kg) helps lower triglyceride levels. A weight-loss plan may include. ? 30 minutes of exercise at least 5 days a week. ? Reducing the amount of calories, sugar, and fat you eat.  Eat a wide variety of fresh fruits, vegetables, and whole grains. These foods are high in fiber.  Eat foods that contain healthy fats, such as fatty fish, nuts, seeds, and olive oil.  Avoid foods that are high in added sugar, added salt (sodium), saturated fat, and trans fat.  Avoid low-fiber, refined carbohydrates such as white bread, crackers, noodles, and white rice.  Avoid foods with partially hydrogenated oils (trans fats), such as fried foods or stick margarine.  Limit alcohol intake to no more than 1 drink a day for nonpregnant women and 2 drinks a day for  men. One drink equals 12 oz of beer, 5 oz of wine, or 1 oz  of hard liquor. Your health care provider may recommend that you drink less depending on your overall health.   Reading food labels  Check food labels for the amount of saturated fat. Choose foods with no or very little saturated fat.  Check food labels for the amount of trans fat. Choose foods with no trans fat.  Check food labels for the amount of cholesterol. Choose foods low in cholesterol. Ask your dietitian how much cholesterol you should have each day.  Check food labels for the amount of sodium. Choose foods with less than 140 milligrams (mg) per serving. Shopping  Buy dairy products labeled as nonfat (skim) or low-fat (1%).  Avoid buying processed or prepackaged foods. These are often high in added sugar, sodium, and fat. Cooking  Choose healthy fats when cooking, such as olive oil or canola oil.  Cook foods using lower fat methods, such as baking, broiling, boiling, or grilling.  Make your own sauces, dressings, and marinades when possible, instead of buying them. Store-bought sauces, dressings, and marinades are often high in sodium and sugar. Meal planning  Eat more home-cooked food and less restaurant, buffet, and fast food.  Eat fatty fish at least 2 times each week. Examples of fatty fish include salmon, trout, mackerel, tuna, and herring.  If you eat whole eggs, do not eat more than 3 egg yolks per week. What foods are recommended? The items listed may not be a complete list. Talk with your dietitian about what dietary choices are best for you. Grains Whole wheat or whole grain breads, crackers, cereals, and pasta. Unsweetened oatmeal. Bulgur. Barley. Quinoa. Brown rice. Whole wheat flour tortillas. Vegetables Fresh or frozen vegetables. Low-sodium canned vegetables. Fruits All fresh, canned (in natural juice), or frozen fruits. Meats and other protein foods Skinless chicken or [place]. Ground chicken or [place]. Lean cuts of pork, trimmed of fat. Fish and seafood,  especially salmon, trout, and herring. Egg whites. Dried beans, peas, or lentils. Unsalted nuts or seeds. Unsalted canned beans. Natural peanut or almond butter. Dairy Low-fat dairy products. Skim or low-fat (1%) milk. Reduced fat (2%) and low-sodium cheese. Low-fat ricotta cheese. Low-fat cottage cheese. Plain, low-fat yogurt. Fats and oils Tub margarine without trans fats. Light or reduced-fat mayonnaise. Light or reduced-fat salad dressings. Avocado. Safflower, olive, sunflower, soybean, and canola oils. What foods are not recommended? The items listed may not be a complete list. Talk with your dietitian about what dietary choices are best for you. Grains White bread. White (regular) pasta. White rice. Cornbread. Bagels. Pastries. Crackers that contain trans fat. Vegetables Creamed or fried vegetables. Vegetables in a cheese sauce. Fruits Sweetened dried fruit. Canned fruit in syrup. Fruit juice. Meats and other protein foods Fatty cuts of meat. Ribs. Chicken wings. Tomasa BlaseBacon. Sausage. Bologna. Salami. Chitterlings. Fatback. Hot dogs. Bratwurst. Packaged lunch meats. Dairy Whole or reduced-fat (2%) milk. Half-and-half. Cream cheese. Full-fat or sweetened yogurt. Full-fat cheese. Nondairy creamers. Whipped toppings. Processed cheese or cheese spreads. Cheese curds. Beverages Alcohol. Sweetened drinks, such as soda, lemonade, fruit drinks, or punches. Fats and oils Butter. Stick margarine. Lard. Shortening. Ghee. Bacon fat. Tropical oils, such as coconut, palm kernel, or palm oils. Sweets and desserts Corn syrup. Sugars. Honey. Molasses. Candy. Jam and jelly. Syrup. Sweetened cereals. Cookies. Pies. Cakes. Donuts. Muffins. Ice cream. Condiments Store-bought sauces, dressings, and marinades that are high in sugar, such as ketchup and barbecue sauce. Summary  High levels of triglycerides can increase the risk of heart disease and stroke. Choosing the right foods can help lower your  triglycerides.  Eat plenty of fresh fruits, vegetables, and whole grains. Choose low-fat dairy and lean meats. Eat fatty fish at least twice a week.  Avoid processed and prepackaged foods with added sugar, sodium, saturated fat, and trans fat.  If you need suggestions or have questions about what types of food are good for you, talk with your health care provider or a dietitian. This information is not intended to replace advice given to you by your health care provider. Make sure you discuss any questions you have with your health care provider. Document Revised: 03/28/2020 Document Reviewed: 03/28/2020 Elsevier Patient Education  2021 [employer].

## 2021-03-16 LAB — LIPID PANEL
Chol/HDL Ratio: 6.6 ratio — ABNORMAL HIGH (ref 0.0–5.0)
Cholesterol, Total: 237 mg/dL — ABNORMAL HIGH (ref 100–199)
HDL: 36 mg/dL — ABNORMAL LOW (ref >39)
LDL Chol Calc (NIH): 181 mg/dL — ABNORMAL HIGH (ref 0–99)
Triglycerides: 110 mg/dL (ref 0–149)
VLDL Cholesterol Cal: 20 mg/dL (ref 5–40)

## 2021-03-16 LAB — GLUCOSE, RANDOM: Glucose: 107 mg/dL — ABNORMAL HIGH (ref 65–99)

## 2023-10-27 ENCOUNTER — Ambulatory Visit
Admission: EM | Admit: 2023-10-27 | Discharge: 2023-10-27 | Disposition: A | Discharge status: Home / Self Care | Payer: Managed Care, Other (non HMO) | Attending: Emergency Medicine | Admitting: Emergency Medicine

## 2023-10-27 ENCOUNTER — Ambulatory Visit: Payer: Managed Care, Other (non HMO)

## 2023-10-27 DIAGNOSIS — J22 Unspecified acute lower respiratory infection: Secondary | ICD-10-CM

## 2023-10-27 HISTORY — DX: Disorder of thyroid, unspecified: E07.9

## 2023-10-27 MED ORDER — PROMETHAZINE-DM 6.25-15 MG/5ML PO SYRP: 5.0000 mL | ORAL_SOLUTION | Freq: Four times a day (QID) | ORAL | 0 refills | Status: AC | PRN

## 2023-10-27 MED ORDER — DOXYCYCLINE HYCLATE 100 MG PO CAPS: 100.0000 mg | ORAL_CAPSULE | Freq: Two times a day (BID) | ORAL | 0 refills | Status: AC

## 2023-10-27 NOTE — ED Triage Notes (Addendum)
cough with chest pain 2 weeks and airway gets blocked when coughing that started yesterday. No fever. Taking tylenol cold and flu.

## 2023-10-27 NOTE — Discharge Instructions (Signed)
Rest,push fluids, take antibiotic cough med as directed. May try using over the counter throat lozenges, mucinex or sudafed as label directed, hot tea, honey for cough.   Any antibiotic may cause upset stomach, resistance, photosensitivity

## 2023-10-27 NOTE — ED Provider Notes (Signed)
[facility]    CSN: [id number] Arrival date & time: 10/27/23  0908      History   Chief Complaint Chief Complaint  Patient presents with   Cough    HPI Nicholas Russell is a 47 y.o. male.   47 year old male pt, Nicholas Russell, presents to urgent care for evaluation of productive cough with chest pain/congestion x 2 weeks, feels like airway gets blocked when coughing. 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 Pt denies palpitations, SOB or DOE.  The history is provided by the patient. No language interpreter was used.    Past Medical History:  Diagnosis Date   Asthma    Gout    Thyroid disease     Patient Active Problem List   Diagnosis Date Noted   Lower respiratory infection 10/27/2023   Family history of abdominal aortic aneurysm (AAA) 05/19/2019   Gout 05/16/2019   Sprain of knee and leg 05/16/2019   Testosterone deficiency 10/28/2016   Fatigue 08/21/2016   Mild intermittent asthma without complication 08/21/2016    Past Surgical History:  Procedure Laterality Date   ANTERIOR CRUCIATE LIGAMENT REPAIR Right 2000, 2001       Home Medications    Prior to Admission medications   Medication Sig Start Date End Date Taking? Authorizing Provider  doxycycline (VIBRAMYCIN) 100 MG capsule Take 1 capsule (100 mg total) by mouth 2 (two) times daily. 10/27/23  Yes Fama Muenchow, Para March, NP  levothyroxine (SYNTHROID) 75 MCG tablet Take by mouth. 09/15/23 09/14/24 Yes [provider]  promethazine-dextromethorphan (PROMETHAZINE-DM) 6.25-15 MG/5ML syrup Take 5 mLs by mouth 4 (four) times daily as needed. 10/27/23  Yes Bryanda Mikel, Para March, NP    Family History Family History  Problem Relation Age of Onset   AAA (abdominal aortic aneurysm) Mother    AAA (abdominal aortic aneurysm) Paternal Aunt     Social History Social History   Tobacco Use   Smoking status: Never     Passive exposure: Never   Smokeless tobacco: Never  Vaping Use   Vaping status: Never Used  Substance Use Topics   Alcohol use: Yes    Alcohol/week: 0.0 standard drinks of alcohol   Drug use: No     Allergies   Patient has no known allergies.   Review of Systems Review of Systems  Constitutional:  Negative for chills and fever.  HENT:  Positive for congestion.   Respiratory:  Positive for cough and chest tightness. Negative for shortness of breath, wheezing and stridor.   Cardiovascular:  Negative for chest pain and palpitations.  All other systems reviewed and are negative.    Physical Exam Triage Vital Signs ED Triage Vitals  Encounter Vitals Group     BP 10/27/23 0939 (!) 129/94     Systolic BP Percentile --      Diastolic BP Percentile --      Pulse Rate 10/27/23 0939 76     Resp 10/27/23 0939 18     Temp 10/27/23 0939 98.6 F (37 C)     Temp Source 10/27/23 0939 Oral     SpO2 10/27/23 0939 98 %     Weight --      Height --      Head Circumference --      Peak Flow --      Pain Score 10/27/23 0937 0     Pain Loc --  Pain Education --      Exclude from Growth Chart --    No data found.  Updated Vital Signs BP (!) 129/94 (BP Location: Left Arm)   Pulse 76   Temp 98.6 F (37 C) (Oral)   Resp 18   SpO2 98%   Visual Acuity Right Eye Distance:   Left Eye Distance:   Bilateral Distance:    Right Eye Near:   Left Eye Near:    Bilateral Near:     Physical Exam Vitals and nursing note reviewed.  Constitutional:      General: He is not in acute distress.    Appearance: He is well-developed. He is not ill-appearing or toxic-appearing.  HENT:     Head: Normocephalic.     Right Ear: Tympanic membrane is retracted.     Left Ear: Tympanic membrane is retracted.     Nose: Mucosal edema and congestion present.     Mouth/Throat:     Mouth: Mucous membranes are moist.     Pharynx: Uvula midline.  Eyes:     General: Lids are normal.      Conjunctiva/sclera: Conjunctivae normal.     Pupils: Pupils are equal, round, and reactive to light.  Cardiovascular:     Rate and Rhythm: Normal rate and regular rhythm.     Pulses: Normal pulses.     Heart sounds: Normal heart sounds.  Pulmonary:     Effort: Pulmonary effort is normal. No respiratory distress.     Breath sounds: Normal breath sounds and air entry. No decreased breath sounds or wheezing.  Abdominal:     General: There is no distension.     Palpations: Abdomen is soft.  Musculoskeletal:        General: Normal range of motion.     Cervical back: Normal range of motion.  Skin:    General: Skin is warm and dry.     Findings: No rash.  Neurological:     General: No focal deficit present.     Mental Status: He is alert and oriented to person, place, and time.     GCS: GCS eye subscore is 4. GCS verbal subscore is 5. GCS motor subscore is 6.     Cranial Nerves: No cranial nerve deficit.     Sensory: No sensory deficit.  Psychiatric:        Speech: Speech normal.        Behavior: Behavior normal. Behavior is cooperative.      UC Treatments / Results  Labs (all labs ordered are listed, but only abnormal results are displayed) Labs Reviewed - No data to display  EKG   Radiology DG Chest 2 View  Result Date: 10/27/2023 CLINICAL DATA:  cough x 2 weeks EXAM: CHEST - 2 VIEW COMPARISON:  None Available. FINDINGS: Metallic jewelry overlies the left chest. The cardiomediastinal silhouette is within normal limits. No pleural effusion. No pneumothorax. No mass or consolidation. No acute osseous abnormality. Degenerative changes of the midthoracic spine. IMPRESSION: No acute findings in the chest. Electronically Signed   By: Olive Bass M.D.   On: 10/27/2023 13:45    Procedures Procedures (including critical care time)  Medications Ordered in UC Medications - No data to display  Initial Impression / Assessment and Plan / UC Course  I have reviewed the triage vital  signs and the nursing notes.  Pertinent labs & imaging results that were available during my care of the patient were reviewed by me and considered in  my medical decision making (see chart for details).    Discussed exam findings and plan of care with patient, strict go to ER precautions given.   Patient verbalized understanding to this provider.  Ddx: Respiratory infection, cough,allergies Final Clinical Impressions(s) / UC Diagnoses   Final diagnoses:  Lower respiratory infection     Discharge Instructions      Rest,push fluids, take antibiotic cough med as directed. May try using over the counter throat lozenges, mucinex or sudafed as label directed, hot tea, honey for cough.   Any antibiotic may cause upset stomach, resistance, photosensitivity     ED Prescriptions     Medication Sig Dispense Auth. Provider   doxycycline (VIBRAMYCIN) 100 MG capsule Take 1 capsule (100 mg total) by mouth 2 (two) times daily. 20 capsule Alex Mcmanigal, NP   promethazine-dextromethorphan (PROMETHAZINE-DM) 6.25-15 MG/5ML syrup Take 5 mLs by mouth 4 (four) times daily as needed. 118 mL Lucious Zou, Para March, NP      PDMP not reviewed this encounter.   Clancy Gourd, NP 10/27/23 [phone]

## 2023-10-28 ENCOUNTER — Ambulatory Visit: Payer: Self-pay

## 2023-11-09 ENCOUNTER — Emergency Department
Admission: EM | Admit: 2023-11-09 | Discharge: 2023-11-09 | Disposition: A | Discharge status: Home / Self Care | Payer: Managed Care, Other (non HMO) | Attending: Emergency Medicine | Admitting: Emergency Medicine

## 2023-11-09 ENCOUNTER — Other Ambulatory Visit: Payer: Self-pay

## 2023-11-09 ENCOUNTER — Emergency Department: Payer: Managed Care, Other (non HMO)

## 2023-11-09 DIAGNOSIS — Z20822 Contact with and (suspected) exposure to covid-19: Secondary | ICD-10-CM | POA: Diagnosis not present

## 2023-11-09 DIAGNOSIS — E039 Hypothyroidism, unspecified: Secondary | ICD-10-CM | POA: Diagnosis not present

## 2023-11-09 DIAGNOSIS — J45909 Unspecified asthma, uncomplicated: Secondary | ICD-10-CM | POA: Diagnosis not present

## 2023-11-09 DIAGNOSIS — B9789 Other viral agents as the cause of diseases classified elsewhere: Secondary | ICD-10-CM | POA: Insufficient documentation

## 2023-11-09 DIAGNOSIS — J069 Acute upper respiratory infection, unspecified: Secondary | ICD-10-CM | POA: Insufficient documentation

## 2023-11-09 DIAGNOSIS — R059 Cough, unspecified: Secondary | ICD-10-CM | POA: Diagnosis present

## 2023-11-09 LAB — RESP PANEL BY RT-PCR (RSV, FLU A&B, COVID)  RVPGX2
Influenza A by PCR: NEGATIVE
Influenza B by PCR: NEGATIVE
Resp Syncytial Virus by PCR: NEGATIVE
SARS Coronavirus 2 by RT PCR: NEGATIVE

## 2023-11-09 LAB — GROUP A STREP BY PCR: Group A Strep by PCR: NOT DETECTED

## 2023-11-09 MED ORDER — PREDNISONE 10 MG (21) PO TBPK: ORAL_TABLET | ORAL | 0 refills | Status: AC

## 2023-11-09 MED ORDER — DEXAMETHASONE SODIUM PHOSPHATE 10 MG/ML IJ SOLN
10.0000 mg | Freq: Once | INTRAMUSCULAR | Status: AC
  Administered 2023-11-09: 10 mg via INTRAMUSCULAR
  Filled 2023-11-09: qty 1

## 2023-11-09 MED ORDER — ALBUTEROL SULFATE HFA 108 (90 BASE) MCG/ACT IN AERS
2.0000 | INHALATION_SPRAY | Freq: Once | RESPIRATORY_TRACT | Status: AC
  Administered 2023-11-09: 2 via RESPIRATORY_TRACT
  Filled 2023-11-09: qty 6.7

## 2023-11-09 MED ORDER — PREDNISONE 20 MG PO TABS
60.0000 mg | ORAL_TABLET | Freq: Once | ORAL | Status: DC
  Filled 2023-11-09: qty 3

## 2023-11-09 NOTE — ED Triage Notes (Signed)
Pt reports cough and shortness of breath for the past 3 weeks. Pt states he feels short of breath while coughing. Pt also reports sore throat

## 2023-11-09 NOTE — Discharge Instructions (Signed)
You may use your inhaler 2 puffs every 2-4 hours as needed for shortness of breath, wheezing.

## 2023-11-09 NOTE — ED Provider Notes (Signed)
[facility] Provider Note    Event Date/Time   First MD Initiated Contact with Patient 11/09/23 [phone]     (approximate)   History   Cough   HPI  Nicholas Russell is a 47 y.o. male with history of asthma, hypothyroidism who presents to the emergency department with cough, congestion ongoing for the past 3 weeks.  No fever.  He feels like whenever he starts to have a coughing fit his throat feels like it is closing up on him.   History provided by patient, wife.    Past Medical History:  Diagnosis Date   Asthma    Gout    Thyroid disease     Past Surgical History:  Procedure Laterality Date   ANTERIOR CRUCIATE LIGAMENT REPAIR Right 2000, 2001    MEDICATIONS:  Prior to Admission medications   Medication Sig Start Date End Date Taking? Authorizing Provider  predniSONE (STERAPRED UNI-PAK 21 TAB) 10 MG (21) TBPK tablet Take as directed 11/09/23  Yes Najib Colmenares, Baxter Hire N, DO  doxycycline (VIBRAMYCIN) 100 MG capsule Take 1 capsule (100 mg total) by mouth 2 (two) times daily. 10/27/23   Defelice, Para March, NP  levothyroxine (SYNTHROID) 75 MCG tablet Take by mouth. 09/15/23 09/14/24  [provider]  promethazine-dextromethorphan (PROMETHAZINE-DM) 6.25-15 MG/5ML syrup Take 5 mLs by mouth 4 (four) times daily as needed. 10/27/23   Clancy Gourd, NP    Physical Exam   Triage Vital Signs: ED Triage Vitals  Encounter Vitals Group     BP 11/09/23 0146 (!) 158/64     Systolic BP Percentile --      Diastolic BP Percentile --      Pulse Rate 11/09/23 0146 71     Resp 11/09/23 0146 16     Temp 11/09/23 0146 97.8 F (36.6 C)     Temp Source 11/09/23 0146 Oral     SpO2 11/09/23 0146 94 %     Weight 11/09/23 0145 265 lb (120.2 kg)     Height 11/09/23 0145 5\' 11"  (1.803 m)     Head Circumference --      Peak Flow --      Pain Score 11/09/23 0145 7     Pain Loc --      Pain Education --      Exclude from Growth Chart --     Most recent vital  signs: Vitals:   11/09/23 0146  BP: (!) 158/64  Pulse: 71  Resp: 16  Temp: 97.8 F (36.6 C)  SpO2: 94%    CONSTITUTIONAL: Alert, responds appropriately to questions. Well-appearing; well-nourished HEAD: Normocephalic, atraumatic EYES: Conjunctivae clear, pupils appear equal, sclera nonicteric ENT: normal nose; moist mucous membranes; No pharyngeal erythema or petechiae, no tonsillar hypertrophy or exudate, no uvular deviation, no unilateral swelling in posterior oropharynx, no trismus or drooling, no muffled voice, slightly hoarse voice, no stridor, airway patent. NECK: Supple, normal ROM CARD: RRR; S1 and S2 appreciated RESP: Normal chest excursion without splinting or tachypnea; breath sounds clear and equal bilaterally; no wheezes, no rhonchi, no rales, no hypoxia or respiratory distress, speaking full sentences ABD/GI: Non-distended; soft, non-tender, no rebound, no guarding, no peritoneal signs BACK: The back appears normal EXT: Normal ROM in all joints; no deformity noted, no edema SKIN: Normal color for age and race; warm; no rash on exposed skin NEURO: Moves all extremities equally, normal speech PSYCH: The patient's mood and manner are appropriate.   ED Results / Procedures / Treatments  LABS: (all labs ordered are listed, but only abnormal results are displayed) Labs Reviewed  RESP PANEL BY RT-PCR (RSV, FLU A&B, COVID)  [id number]  GROUP A STREP BY PCR     EKG:  RADIOLOGY: My personal review and interpretation of imaging: Chest x-ray clear.  I have personally reviewed all radiology reports.   DG Chest 2 View  Result Date: 11/09/2023 CLINICAL DATA:  Cough and shortness of breath x3 weeks. EXAM: CHEST - 2 VIEW COMPARISON:  October 27, 2023 FINDINGS: The heart size and mediastinal contours are within normal limits. Both lungs are clear. The visualized skeletal structures are unremarkable. IMPRESSION: No active cardiopulmonary disease. Electronically Signed   By:  Aram Candela M.D.   On: 11/09/2023 02:49     PROCEDURES:  Critical Care performed: No    Procedures    IMPRESSION / MDM / ASSESSMENT AND PLAN / ED COURSE  I reviewed the triage vital signs and the nursing notes.    Patient here with history of asthma with symptoms of cough and congestion for 3 weeks.    DIFFERENTIAL DIAGNOSIS (includes but not limited to):   Viral URI, bronchitis, asthma exacerbation, pneumonia, pharyngitis, doubt PTA or deep space neck infection, no sign of uvulitis   Patient's presentation is most consistent with acute presentation with potential threat to life or bodily function.   PLAN: COVID, flu, RSV, strep negative.  Chest x-ray reviewed and interpreted by myself with the radiologist and is clear.  Given his history of asthma, will discharge on prednisone taper and with albuterol inhaler.  No indication for antibiotics today.  Patient airway is patent.  No signs of deep space neck infection, PTA, uvulitis.  Tongue sits flat in the bottom of the mouth.  He has slightly hoarse voice but otherwise phonation is normal.  No increased work of breathing, respiratory distress and lungs are clear to auscultation.  I feel he is safe for discharge with close outpatient follow-up if symptoms not improving.   MEDICATIONS GIVEN IN ED: Medications  albuterol (VENTOLIN HFA) 108 (90 Base) MCG/ACT inhaler 2 puff (has no administration in time range)  predniSONE (DELTASONE) tablet 60 mg (has no administration in time range)     ED COURSE:  At this time, I do not feel there is any life-threatening condition present. I reviewed all nursing notes, vitals, pertinent previous records.  All lab and urine results, EKGs, imaging ordered have been independently reviewed and interpreted by myself.  I reviewed all available radiology reports from any imaging ordered this visit.  Based on my assessment, I feel the patient is safe to be discharged home without further emergent  workup and can continue workup as an outpatient as needed. Discussed all findings, treatment plan as well as usual and customary return precautions.  They verbalize understanding and are comfortable with this plan.  Outpatient follow-up has been provided as needed.  All questions have been answered.    CONSULTS:  none   OUTSIDE RECORDS REVIEWED: Reviewed last PCP note on 09/14/2023.       FINAL CLINICAL IMPRESSION(S) / ED DIAGNOSES   Final diagnoses:  Viral URI with cough     Rx / DC Orders   ED Discharge Orders          Ordered    predniSONE (STERAPRED UNI-PAK 21 TAB) 10 MG (21) TBPK tablet        11/09/23 0427             Note:  This document  was prepared using Conservation officer, historic buildings and may include unintentional dictation errors.   Susannah Carbin, Layla Maw, DO 11/09/23 [phone]
